# Patient Record
Sex: Female | Born: 1975 | Race: Black or African American | Hispanic: No | Marital: Single | Smoking: Never smoker
Health system: Southern US, Community
[De-identification: ages and names within clinical notes are randomized; demographics above are authoritative.]

## PROBLEM LIST (undated history)

## (undated) DIAGNOSIS — Z801 Family history of malignant neoplasm of trachea, bronchus and lung: Secondary | ICD-10-CM

## (undated) DIAGNOSIS — I10 Essential (primary) hypertension: Secondary | ICD-10-CM

## (undated) DIAGNOSIS — Z923 Personal history of irradiation: Secondary | ICD-10-CM

## (undated) DIAGNOSIS — Z8049 Family history of malignant neoplasm of other genital organs: Secondary | ICD-10-CM

## (undated) DIAGNOSIS — R519 Headache, unspecified: Secondary | ICD-10-CM

## (undated) DIAGNOSIS — F419 Anxiety disorder, unspecified: Secondary | ICD-10-CM

## (undated) DIAGNOSIS — Z803 Family history of malignant neoplasm of breast: Secondary | ICD-10-CM

## (undated) DIAGNOSIS — Z973 Presence of spectacles and contact lenses: Secondary | ICD-10-CM

## (undated) HISTORY — PX: EYE SURGERY: SHX253

## (undated) HISTORY — DX: Family history of malignant neoplasm of breast: Z80.3

## (undated) HISTORY — PX: WISDOM TOOTH EXTRACTION: SHX21

## (undated) HISTORY — DX: Family history of malignant neoplasm of trachea, bronchus and lung: Z80.1

## (undated) HISTORY — DX: Family history of malignant neoplasm of other genital organs: Z80.49

---

## 2005-12-29 ENCOUNTER — Encounter: Admission: RE | Admit: 2005-12-29 | Discharge: 2005-12-29 | Payer: Self-pay | Admitting: Occupational Medicine

## 2019-09-16 ENCOUNTER — Other Ambulatory Visit: Payer: Self-pay | Admitting: Obstetrics and Gynecology

## 2019-09-16 DIAGNOSIS — R928 Other abnormal and inconclusive findings on diagnostic imaging of breast: Secondary | ICD-10-CM

## 2020-04-24 ENCOUNTER — Other Ambulatory Visit: Payer: Self-pay

## 2020-04-24 ENCOUNTER — Ambulatory Visit
Admission: RE | Admit: 2020-04-24 | Discharge: 2020-04-24 | Disposition: A | Discharge status: Home / Self Care | Payer: Managed Care, Other (non HMO) | Source: Ambulatory Visit | Attending: Obstetrics and Gynecology | Admitting: Obstetrics and Gynecology

## 2020-04-24 ENCOUNTER — Other Ambulatory Visit: Payer: Self-pay | Admitting: Obstetrics and Gynecology

## 2020-04-24 DIAGNOSIS — R928 Other abnormal and inconclusive findings on diagnostic imaging of breast: Secondary | ICD-10-CM

## 2020-04-26 ENCOUNTER — Other Ambulatory Visit: Payer: Self-pay

## 2020-04-26 ENCOUNTER — Ambulatory Visit
Admission: RE | Admit: 2020-04-26 | Discharge: 2020-04-26 | Disposition: A | Discharge status: Home / Self Care | Payer: Managed Care, Other (non HMO) | Source: Ambulatory Visit | Attending: Obstetrics and Gynecology | Admitting: Obstetrics and Gynecology

## 2020-04-26 DIAGNOSIS — C50919 Malignant neoplasm of unspecified site of unspecified female breast: Secondary | ICD-10-CM

## 2020-04-26 DIAGNOSIS — R928 Other abnormal and inconclusive findings on diagnostic imaging of breast: Secondary | ICD-10-CM

## 2020-04-26 HISTORY — DX: Malignant neoplasm of unspecified site of unspecified female breast: C50.919

## 2020-04-30 ENCOUNTER — Other Ambulatory Visit: Payer: Self-pay

## 2020-04-30 ENCOUNTER — Other Ambulatory Visit: Payer: Self-pay | Admitting: Obstetrics and Gynecology

## 2020-04-30 ENCOUNTER — Ambulatory Visit
Admission: RE | Admit: 2020-04-30 | Discharge: 2020-04-30 | Disposition: A | Discharge status: Home / Self Care | Payer: Managed Care, Other (non HMO) | Source: Ambulatory Visit | Attending: Obstetrics and Gynecology | Admitting: Obstetrics and Gynecology

## 2020-04-30 ENCOUNTER — Other Ambulatory Visit: Payer: Self-pay | Admitting: General Surgery

## 2020-04-30 DIAGNOSIS — C50411 Malignant neoplasm of upper-outer quadrant of right female breast: Secondary | ICD-10-CM

## 2020-04-30 DIAGNOSIS — Z170 Estrogen receptor positive status [ER+]: Secondary | ICD-10-CM

## 2020-04-30 DIAGNOSIS — C50911 Malignant neoplasm of unspecified site of right female breast: Secondary | ICD-10-CM

## 2020-05-02 ENCOUNTER — Other Ambulatory Visit: Payer: Self-pay | Admitting: General Surgery

## 2020-05-02 DIAGNOSIS — Z170 Estrogen receptor positive status [ER+]: Secondary | ICD-10-CM

## 2020-05-02 DIAGNOSIS — C50411 Malignant neoplasm of upper-outer quadrant of right female breast: Secondary | ICD-10-CM

## 2020-05-07 ENCOUNTER — Encounter (HOSPITAL_BASED_OUTPATIENT_CLINIC_OR_DEPARTMENT_OTHER): Payer: Self-pay | Admitting: General Surgery

## 2020-05-07 ENCOUNTER — Other Ambulatory Visit: Payer: Self-pay

## 2020-05-07 ENCOUNTER — Telehealth: Payer: Self-pay | Admitting: Hematology and Oncology

## 2020-05-07 NOTE — Telephone Encounter (Signed)
Ms. Kowalchuk has been rescheduled to see Dr. Lindi Adie and Raquel Sarna to 5/10 due to her sx date being on 4/29.

## 2020-05-07 NOTE — Progress Notes (Signed)
New Breast Cancer Diagnosis: Right Breast UOQ  Did patient present with symptoms (if so, please note symptoms) or screening mammography?:Screening Calcifications    Location and Extent of disease : right breast. Located in the upper outer quadrant, measured  5 mm in greatest dimension.   Histology per Pathology Report: grade 1-2, DCIS with calcifications 04/26/2020  Receptor Status: ER(positive), PR (positive), Her2-neu (), Ki-(%)  Surgeon and surgical plan, if any: Dr. Barry Dienes 04/30/2020 -Right Breast lumpectomy with radioactive seed localization 05/11/2020 -I will refer her for genetic testing given her age. -I will also refer for medical and radiation oncology consultations.  Medical oncologist, treatment if any:   Dr. Lindi Adie 05/22/2020   Family History of Breast/Ovarian/Prostate Cancer: Maternal Grandmother had cervical cancer.  Maternal second cousin had breast cancer.  Paternal great Aunt had breast cancer.  Lymphedema issues, if any: No    Pain issues, if any:  No  SAFETY ISSUES: Prior radiation? No Pacemaker/ICD? No Possible current pregnancy? Having cycles Is the patient on methotrexate? no  Current Complaints / other details:

## 2020-05-08 ENCOUNTER — Other Ambulatory Visit (HOSPITAL_COMMUNITY)
Admission: RE | Admit: 2020-05-08 | Discharge: 2020-05-08 | Disposition: A | Discharge status: Home / Self Care | Payer: Managed Care, Other (non HMO) | Source: Ambulatory Visit | Attending: General Surgery | Admitting: General Surgery

## 2020-05-08 DIAGNOSIS — Z01812 Encounter for preprocedural laboratory examination: Secondary | ICD-10-CM | POA: Insufficient documentation

## 2020-05-08 DIAGNOSIS — Z20822 Contact with and (suspected) exposure to covid-19: Secondary | ICD-10-CM | POA: Insufficient documentation

## 2020-05-09 ENCOUNTER — Other Ambulatory Visit: Payer: Self-pay

## 2020-05-09 ENCOUNTER — Encounter: Payer: Self-pay | Admitting: Licensed Clinical Social Worker

## 2020-05-09 ENCOUNTER — Ambulatory Visit
Admission: RE | Admit: 2020-05-09 | Discharge: 2020-05-09 | Disposition: A | Discharge status: Home / Self Care | Payer: Managed Care, Other (non HMO) | Source: Ambulatory Visit | Attending: Radiation Oncology | Admitting: Radiation Oncology

## 2020-05-09 ENCOUNTER — Encounter: Payer: Self-pay | Admitting: Radiation Oncology

## 2020-05-09 VITALS — BP 147/99 | HR 72 | Temp 97.8°F | Resp 18 | Ht 63.0 in | Wt 180.2 lb

## 2020-05-09 DIAGNOSIS — D0511 Intraductal carcinoma in situ of right breast: Secondary | ICD-10-CM | POA: Diagnosis present

## 2020-05-09 DIAGNOSIS — Z170 Estrogen receptor positive status [ER+]: Secondary | ICD-10-CM | POA: Diagnosis not present

## 2020-05-09 DIAGNOSIS — Z79899 Other long term (current) drug therapy: Secondary | ICD-10-CM | POA: Insufficient documentation

## 2020-05-09 LAB — SARS CORONAVIRUS 2 (TAT 6-24 HRS): SARS Coronavirus 2: NEGATIVE

## 2020-05-09 NOTE — Progress Notes (Signed)
Radiation Oncology         [phone] ________________________________  Name: Vanessa Ewing        MRN: [id number]  Date of Service: 05/09/2020 DOB: [birth year]  IR:CVELFYBO, Marshall Cork., MD  Stark Klein, MD     REFERRING PHYSICIAN: Stark Klein, MD   DIAGNOSIS: The encounter diagnosis was Ductal carcinoma in situ (DCIS) of right breast.   HISTORY OF PRESENT ILLNESS: Vanessa Ewing is a 45 y.o. female seen at the request of Dr. Barry Dienes for a new diagnosis of right breast cancer.  Patient was found to have calcifications in the right breast and this was seen on screening mammography.  These were noted in the upper outer quadrant and spanned approximately 5 mm.  No ultrasound of the axilla was required and she underwent a stereotactic biopsy on 04/30/2020 that showed an intermediate grade DCIS that was ER/PR positive.  Her case was discussed in multidisciplinary breast oncology conference and includes lumpectomy and adjuvant radiotherapy and antiestrogen.  She is seen today to discuss treatment of her cancer.  She is scheduled to undergo lumpectomy on 05/11/2020.  She is scheduled to meet with Dr. Lindi Adie on 05/22/2020.    PREVIOUS RADIATION THERAPY: No   PAST MEDICAL HISTORY:  Past Medical History:  Diagnosis Date  . Anxiety   . Breast cancer ([facility]) 04/26/2020       PAST SURGICAL HISTORY: Past Surgical History:  Procedure Laterality Date  . CESAREAN SECTION    . EYE SURGERY    . WISDOM TOOTH EXTRACTION       FAMILY HISTORY: History reviewed. No pertinent family history.   SOCIAL HISTORY:  reports that she has never smoked. She has never used smokeless tobacco. She reports current alcohol use. She reports that she does not use drugs. The patient has a teenage daughter. 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    ALLERGIES: Patient has no known allergies.   MEDICATIONS:  Current Outpatient Medications  Medication Sig Dispense Refill  .  ALPRAZolam (XANAX) 0.25 MG tablet Take 0.25 mg by mouth at bedtime as needed for anxiety.    . Biotin 10 MG TABS Take by mouth.    . Cholecalciferol (VITAMIN D) 50 MCG (2000 UT) CAPS Take by mouth.    . zolpidem (AMBIEN) 10 MG tablet Take 10 mg by mouth at bedtime as needed for sleep.     No current facility-administered medications for this encounter.     REVIEW OF SYSTEMS: On review of systems, the patient reports that she is doing well overall. She is nervous about her diagnosis but has read a lot and is happy to move forward with her visits and get her surgery done soon.     PHYSICAL EXAM:  Wt Readings from Last 3 Encounters:  05/09/20 180 lb 3.2 oz (81.7 kg)   Temp Readings from Last 3 Encounters:  05/09/20 97.8 F (36.6 C)   BP Readings from Last 3 Encounters:  05/09/20 (!) 147/99   Pulse Readings from Last 3 Encounters:  05/09/20 72    In general this is a well appearing African American female in no acute distress. She's alert and oriented x4 and appropriate throughout the examination. Cardiopulmonary assessment is negative for acute distress and she exhibits normal effort. Bilateral breast exam is deferred.    ECOG = 0  0 - Asymptomatic (Fully active, able to carry on all predisease activities without restriction)  1 - Symptomatic but completely ambulatory (Restricted  in physically strenuous activity but ambulatory and able to carry out work of a light or sedentary nature. For example, light housework, office work)  2 - Symptomatic, <50% in bed during the day (Ambulatory and capable of all self care but unable to carry out any work activities. Up and about more than 50% of waking hours)  3 - Symptomatic, >50% in bed, but not bedbound (Capable of only limited self-care, confined to bed or chair 50% or more of waking hours)  4 - Bedbound (Completely disabled. Cannot carry on any self-care. Totally confined to bed or chair)  5 - Death   Eustace Pen MM, Creech RH, Tormey  DC, et al. [phone]). "Toxicity and response criteria of the [facility]". [place] Oncol. 5 (6): 649-55    LABORATORY DATA:  No results found for: WBC, HGB, HCT, MCV, PLT No results found for: NA, K, CL, CO2 No results found for: ALT, AST, GGT, ALKPHOS, BILITOT    RADIOGRAPHY: MM Digital Diagnostic Unilat R  Result Date: 04/24/2020 CLINICAL DATA:  Patient presents after screening study for evaluation of RIGHT breast calcifications. EXAM: DIGITAL DIAGNOSTIC UNILATERAL RIGHT MAMMOGRAM TECHNIQUE: Right digital diagnostic mammography was performed. Mammographic images were processed with CAD. COMPARISON:  Baseline screening exam performed 09/13/2019 ACR Breast Density Category c: The breast tissue is heterogeneously dense, which may obscure small masses. FINDINGS: Magnified views are performed of calcifications in the UPPER OUTER QUADRANT of the RIGHT breast. These views demonstrate a group of fine pleomorphic calcifications not associated with layering on true LATERAL projection. Calcifications span 0.5 x 0.3 x 0.3 centimeters. IMPRESSION: Indeterminate RIGHT breast calcifications. RECOMMENDATION: Recommend stereotactic guided core biopsy of RIGHT breast calcifications. I have discussed the findings and recommendations with the patient. If applicable, a reminder letter will be sent to the patient regarding the next appointment. BI-RADS CATEGORY  4: Suspicious. Electronically Signed   By: Nolon Nations M.D.   On: 04/24/2020 14:09   MM DIAG BREAST TOMO UNI LEFT  Result Date: 04/30/2020 CLINICAL DATA:  45 year old female recently diagnosed right breast DCIS presenting for screening of the left breast prior to surgery. EXAM: DIGITAL DIAGNOSTIC UNILATERAL LEFT MAMMOGRAM WITH TOMOSYNTHESIS AND CAD TECHNIQUE: Left digital diagnostic mammography and breast tomosynthesis was performed. The images were evaluated with computer-aided detection. COMPARISON:  Previous exam(s). ACR Breast  Density Category c: The breast tissue is heterogeneously dense, which may obscure small masses. FINDINGS: No suspicious calcifications, masses or areas of distortion are seen in the left breast. An asymmetry was initially seen in the central posterior left breast, which resolved with spot compression tomosynthesis images. IMPRESSION: No evidence left breast malignancy. RECOMMENDATION: 1.  Continue treatment plan for known right breast DCIS. 2. Annual bilateral diagnostic mammography will be due in August of 2022. I have discussed the findings and recommendations with the patient. If applicable, a reminder letter will be sent to the patient regarding the next appointment. BI-RADS CATEGORY  1: Negative. Electronically Signed   By: Ammie Ferrier M.D.   On: 04/30/2020 14:50   MM CLIP PLACEMENT RIGHT  Result Date: 04/26/2020 CLINICAL DATA:  45 year old female status post stereotactic guided biopsy of the right breast. EXAM: DIAGNOSTIC RIGHT MAMMOGRAM POST STEREOTACTIC BIOPSY COMPARISON:  Previous exam(s). FINDINGS: Mammographic images were obtained following stereotactic guided biopsy of the right breast. The biopsy marking clip is in expected position at the site of biopsy. IMPRESSION: Appropriate positioning of the coil shaped biopsy marking clip at the site of biopsy in the upper  outer right breast. Final Assessment: Post Procedure Mammograms for Marker Placement Electronically Signed   By: Kristopher Oppenheim M.D.   On: 04/26/2020 08:16   MM RT BREAST BX W LOC DEV 1ST LESION IMAGE BX SPEC STEREO GUIDE  Addendum Date: 05/02/2020   ADDENDUM REPORT: 04/30/2020 13:36 ADDENDUM: Pathology revealed LOW to INTERMEDIATE GRADE DUCTAL CARCINOMA IN SITU WITH CALCIFICATIONS of the RIGHT breast, upper outer quadrant, posterior. This was found to be concordant by Dr. Kristopher Oppenheim. Pathology results were discussed with the patient by telephone. The patient reported doing well after the biopsy with tenderness at the site.  Post biopsy instructions and care were reviewed and questions were answered. The patient was encouraged to call [facility] for any additional concerns. My direct phone number was provided. Surgical consultation has been arranged with Dr. Stark Klein at [facility] on April 30, 2020. The patient is scheduled for a LEFT diagnostic mammogram on April 30, 2020. Consideration for a bilateral breast MRI to exclude any additional sites of disease given age and breast density. Pathology results reported by Terie Purser, RN on 04/30/2020. Electronically Signed   By: Kristopher Oppenheim M.D.   On: 04/30/2020 13:36   Result Date: 05/02/2020 CLINICAL DATA:  45 year old female with indeterminate right breast calcifications. EXAM: RIGHT BREAST STEREOTACTIC CORE NEEDLE BIOPSY COMPARISON:  Previous exams. FINDINGS: The patient and I discussed the procedure of stereotactic-guided biopsy including benefits and alternatives. We discussed the high likelihood of a successful procedure. We discussed the risks of the procedure including infection, bleeding, tissue injury, clip migration, and inadequate sampling. Informed written consent was given. The usual time out protocol was performed immediately prior to the procedure. Using sterile technique and 1% Lidocaine as local anesthetic, under stereotactic guidance, a 9 gauge vacuum assisted device was used to perform core needle biopsy of calcifications in the upper-outer quadrant of the right breast using a superior approach. Specimen radiograph was performed showing calcifications in multiple specimens. Specimens with calcifications are identified for pathology. Lesion quadrant: Upper outer quadrant At the conclusion of the procedure, a coil shaped tissue marker clip was deployed into the biopsy cavity. Follow-up 2-view mammogram was performed and dictated separately. IMPRESSION: Stereotactic-guided biopsy of the right breast. No apparent  complications. Electronically Signed: By: Kristopher Oppenheim M.D. On: 04/26/2020 08:17       IMPRESSION/PLAN: 1. ER/PR positive Intermediate Grade DCIS of the right breast. Dr. Lisbeth Renshaw discusses the pathology findings and reviews the nature of early stage breast disease. The consensus from the breast conference includes breast conservation with lumpectomy. Dr. Lisbeth Renshaw reviews the role of external radiotherapy to the breast  to reduce risks of local recurrence followed by antiestrogen therapy. We discussed the risks, benefits, short, and long term effects of radiotherapy, as well as the curative intent, and the patient is interested in proceeding. Dr. Lisbeth Renshaw discusses the delivery and logistics of radiotherapy and anticipates a course of 6 1/2 weeks of radiotherapy. We will see her back a few weeks after surgery to discuss the simulation process and anticipate we starting radiotherapy about 4-6 weeks after surgery.  2. Possible genetic predisposition to malignancy. The patient is a candidate for genetic testing given her personal history. She was offered referral and has already been scheduled for this on 05/22/20. 3. Contraceptive counseling. The patient is not sexually active but is aware if she is within 2 weeks of her next visit with [place], we would recommend pregnancy testing prior to treatment. She's aware  of the need to avoid pregnancy during treatment and if active, should use condoms with a female partner.     In a visit lasting 60 minutes, greater than 50% of the time was spent face to face with Dr. Lisbeth Renshaw and myself (removely via [place]) reviewing her case, as well as in preparation of, discussing, and coordinating the patient's care.  The above documentation reflects my direct findings during this shared patient visit. Please see the separate note by Dr. Lisbeth Renshaw on this date for the remainder of the patient's plan of care.    Carola Rhine, [facility]    **Disclaimer: This note was dictated with voice  recognition software. Similar sounding words can inadvertently be transcribed and this note may contain transcription errors which may not have been corrected upon publication of note.**

## 2020-05-09 NOTE — Progress Notes (Signed)
[place] Psychosocial Distress Screening Clinical Social Work  Clinical Social Work was referred by distress screening protocol.  The patient scored a 5 on the Psychosocial Distress Thermometer which indicates moderate distress. Clinical Social Worker attempted to contact patient by phone to assess for distress and other psychosocial needs.  No answer. Left VM with direct contact information.  ONCBCN DISTRESS SCREENING 05/09/2020  Screening Type Initial Screening  Distress experienced in past week (1-10) 5  Practical problem type Work/school  Emotional problem type Nervousness/Anxiety;Adjusting to illness  Other Contact via cell phone     Jervis Trapani E Aleksa Collinsworth, LCSW

## 2020-05-09 NOTE — Addendum Note (Signed)
Encounter addended by: Cori Razor, RN on: 05/09/2020 8:58 AM  Actions taken: Visit diagnoses modified

## 2020-05-10 ENCOUNTER — Ambulatory Visit
Admission: RE | Admit: 2020-05-10 | Discharge: 2020-05-10 | Disposition: A | Discharge status: Home / Self Care | Payer: Managed Care, Other (non HMO) | Source: Ambulatory Visit | Attending: General Surgery | Admitting: General Surgery

## 2020-05-10 DIAGNOSIS — C50411 Malignant neoplasm of upper-outer quadrant of right female breast: Secondary | ICD-10-CM

## 2020-05-10 DIAGNOSIS — Z170 Estrogen receptor positive status [ER+]: Secondary | ICD-10-CM

## 2020-05-10 NOTE — Anesthesia Preprocedure Evaluation (Addendum)
Anesthesia Evaluation  Patient identified by MRN, date of birth, ID band Patient awake    Reviewed: Allergy & Precautions, NPO status , Patient's Chart, lab work & pertinent test results  Airway Mallampati: II  TM Distance: >3 FB Neck ROM: Full    Dental no notable dental hx.    Pulmonary neg pulmonary ROS,    Pulmonary exam normal breath sounds clear to auscultation       Cardiovascular negative cardio ROS Normal cardiovascular exam Rhythm:Regular Rate:Normal     Neuro/Psych Anxiety negative neurological ROS     GI/Hepatic negative GI ROS, Neg liver ROS,   Endo/Other  negative endocrine ROS  Renal/GU negative Renal ROS     Musculoskeletal negative musculoskeletal ROS (+)   Abdominal (+) + obese,   Peds  Hematology negative hematology ROS (+)   Anesthesia Other Findings RIGHT BREAST CANCER  Reproductive/Obstetrics hcg negative                            Anesthesia Physical Anesthesia Plan  ASA: II  Anesthesia Plan: General   Post-op Pain Management:    Induction: Intravenous  PONV Risk Score and Plan: 3 and Ondansetron, Dexamethasone, Midazolam and Treatment may vary due to age or medical condition  Airway Management Planned: LMA  Additional Equipment:   Intra-op Plan:   Post-operative Plan: Extubation in OR  Informed Consent: I have reviewed the patients History and Physical, chart, labs and discussed the procedure including the risks, benefits and alternatives for the proposed anesthesia with the patient or authorized representative who has indicated his/her understanding and acceptance.     Dental advisory given  Plan Discussed with: CRNA  Anesthesia Plan Comments:        Anesthesia Quick Evaluation

## 2020-05-10 NOTE — H&P (Signed)
Vanessa Ewing Appointment: 04/30/2020 4:30 PM Location: [facility] Patient #: [id number] DOB: December 30, 1975 Single / Language: [name] / Race: Black or African American Female   History of Present Illness Vanessa Klein MD; 04/30/2020 6:29 PM) The patient is a 45 year old female who presents with breast cancer. Pt is a 45 yo F referred for consultation by Dr. Jeanmarie Ewing for a new diagnosis of right breast cancer. She had screening detected right breast calcifications. These were 5 mm in the UOQ. 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 She doesn't have any personal history of cancer before this.  She has some family cancer history, but no strong links. Her maternal grandmother had cervical cancer. A maternal second cousin had breast cancer. Her paternal grandmother had lung cancer and a paternal great aunt had breast cancer. She had menarche at age 11. She is a G2P1 with first child in her late 60s.   dx mammogram 04/24/20 ACR Breast Density Category c: The breast tissue is heterogeneously dense, which may obscure small masses.  FINDINGS: Magnified views are performed of calcifications in the UPPER OUTER QUADRANT of the RIGHT breast. These views demonstrate a group of fine pleomorphic calcifications not associated with layering on true LATERAL projection. Calcifications span 0.5 x 0.3 x 0.3 centimeters.  IMPRESSION: Indeterminate RIGHT breast calcifications.  RECOMMENDATION: Recommend stereotactic guided core biopsy of RIGHT breast calcifications.  I have discussed the findings and recommendations with the patient. If applicable, a reminder letter will be sent to the patient regarding the next appointment.  BI-RADS CATEGORY 4: Suspicious.   pathology 04/26/20 Breast, right, needle core biopsy, UOQ posterior - DUCTAL CARCINOMA IN SITU WITH CALCIFICATIONS. - SEE MICROSCOPIC DESCRIPTION. Microscopic Comment The DCIS has low to intermediate  nuclear grade. Estrogen Receptor: 80%, POSITIVE, MODERATE-STRONG STAINING INTENSITY Progesterone Receptor: 95%, POSITIVE, STRONG STAINING INTENSITY    Past Surgical History Vanessa Ewing, CMA; 04/30/2020 4:37 PM) Breast Biopsy  Right. Cesarean Section - 1   Diagnostic Studies History Vanessa Ewing, CMA; 04/30/2020 4:37 PM) Colonoscopy  never Mammogram  within last year Pap Smear  1-5 years ago  Allergies Vanessa Ewing, CMA; 04/30/2020 4:37 PM) No Known Drug Allergies  [04/30/2020]: Allergies Reconciled   Medication History Vanessa Ewing, CMA; 04/30/2020 4:39 PM) Ambien (10MG  Tablet, Oral) Active. Xanax (2MG  Tablet, Oral) Active. Vitamin D (Cholecalciferol) (10 MCG(400 UNIT) Capsule, Oral) Active. Medications Reconciled  Social History Vanessa Ewing, CMA; 04/30/2020 4:37 PM) Alcohol use  Occasional alcohol use. Caffeine use  Carbonated beverages, Coffee, Tea. No drug use   Family History Vanessa Ewing, CMA; 04/30/2020 4:37 PM) Breast Cancer  Family Members In General. Cancer  Family Members In General. Cervical Cancer  Family Members In General. Diabetes Mellitus  Family Members In General.  Pregnancy / Birth History Vanessa Ewing, [place]; 04/30/2020 4:37 PM) Age at menarche  37 years. Contraceptive History  Oral contraceptives. Gravida  2 Maternal age  42-20 Para  1 Regular periods   Other Problems Vanessa Ewing, CMA; 04/30/2020 4:37 PM) Anxiety Disorder  Breast Cancer  Migraine Headache  Thyroid Disease     Review of Systems Vanessa Ewing CMA; 04/30/2020 4:37 PM) General Not Present- Appetite Loss, Chills, Fatigue, Fever, Night Sweats, Weight Gain and Weight Loss. Breast Not Present- Breast Mass, Breast Pain, Nipple Discharge and Skin Changes. Female Genitourinary Not Present- Frequency, Nocturia, Painful Urination, Pelvic Pain and Urgency. Musculoskeletal Not Present- Back Pain, Joint Pain, Joint Stiffness, Muscle Pain, Muscle Weakness and Swelling of  Extremities. Neurological Present- Headaches.  Not Present- Decreased Memory, Fainting, Numbness, Seizures, Tingling, Tremor, Trouble walking and Weakness. Psychiatric Present- Anxiety. Not Present- Bipolar, Change in Sleep Pattern, Depression, Fearful and Frequent crying. Endocrine Not Present- Cold Intolerance, Excessive Hunger, Hair Changes, Heat Intolerance, Hot flashes and New Diabetes.  Vitals Vanessa Ewing CMA; 04/30/2020 4:39 PM) 04/30/2020 4:39 PM Weight: 180.19 lb Height: 63in Body Surface Area: 1.85 m Body Mass Index: 31.92 kg/m  Temp.: 97.48F  Pulse: 79 (Regular)  P.OX: 99% (Room air) BP: 122/82(Sitting, Left Arm, Standard)       Physical Exam Vanessa Klein MD; 04/30/2020 6:30 PM) General Mental Status-Alert. General Appearance-Consistent with stated age. Hydration-Well hydrated. Voice-Normal.  Head and Neck Head-normocephalic, atraumatic with no lesions or palpable masses. Trachea-midline. Thyroid Gland Characteristics - normal size and consistency.  Eye Eyeball - Bilateral-Extraocular movements intact. Sclera/Conjunctiva - Bilateral-No scleral icterus.  Chest and Lung Exam Chest and lung exam reveals -quiet, even and easy respiratory effort with no use of accessory muscles and on auscultation, normal breath sounds, no adventitious sounds and normal vocal resonance. Inspection Chest Wall - Normal. Back - normal.  Breast Note: minimal ptosis, breasts relatively symmetric bilaterally. no palpable masses. no LAD. no nipple retraction or discharge. no skin dimpling.   Cardiovascular Cardiovascular examination reveals -normal heart sounds, regular rate and rhythm with no murmurs and normal pedal pulses bilaterally.  Abdomen Inspection Inspection of the abdomen reveals - No Hernias. Palpation/Percussion Palpation and Percussion of the abdomen reveal - Soft, Non Tender, No Rebound tenderness, No Rigidity (guarding) and No  hepatosplenomegaly. Auscultation Auscultation of the abdomen reveals - Bowel sounds normal.  Neurologic Neurologic evaluation reveals -alert and oriented x 3 with no impairment of recent or remote memory. Mental Status-Normal.  Musculoskeletal Global Assessment -Note: no gross deformities.  Normal Exam - Left-Upper Extremity Strength Normal and Lower Extremity Strength Normal. Normal Exam - Right-Upper Extremity Strength Normal and Lower Extremity Strength Normal.  Lymphatic Head & Neck  General Head & Neck Lymphatics: Bilateral - Description - Normal. Axillary  General Axillary Region: Bilateral - Description - Normal. Tenderness - Non Tender. Femoral & Inguinal  Generalized Femoral & Inguinal Lymphatics: Bilateral - Description - No Generalized lymphadenopathy.    Assessment & Plan Vanessa Klein MD; 04/30/2020 6:31 PM) MALIGNANT NEOPLASM OF UPPER-OUTER QUADRANT OF RIGHT BREAST IN FEMALE, ESTROGEN RECEPTOR POSITIVE (C50.411) Impression: Pt has a new dx of stage 0 right breast cancer. I discussed options with the patient. She would like to proceed wtih lumpectomy. I will set her up for a seed localized lumpectomy on the right. I discussed pros and cons of MRI. We opted against it.  I will refer her for genetic testing given her age. I will also refer for medical and radiation oncology consultations. These are OK to be post op.  The surgical procedure was described to the patient. I discussed the incision type and location and that we would need radiology involved on with a wire or seed marker and/or sentinel node.  The risks and benefits of the procedure were described to the patient and she wishes to proceed.  We discussed the risks bleeding, infection, damage to other structures, need for further procedures/surgeries. We discussed the risk of seroma. The patient was advised if the area in the breast in cancer, we may need to go back to surgery for additional tissue to  obtain negative margins or for a lymph node biopsy. The patient was advised that these are the most common complications, but that others can occur as well.  They were advised against taking aspirin or other anti-inflammatory agents/blood thinners the week before surgery. Current Plans You are being scheduled for surgery- Our schedulers will call you.  You should hear from our office's scheduling department within 5 working days about the location, date, and time of surgery. We try to make accommodations for patient's preferences in scheduling surgery, but sometimes the OR schedule or the surgeon's schedule prevents [place] from making those accommodations.  If you have not heard from our office [phone]) in 5 working days, call the office and ask for your surgeon's nurse.  If you have other questions about your diagnosis, plan, or surgery, call the office and ask for your surgeon's nurse.  Pt Education - flb breast cancer surgery: discussed with patient and provided information. Referred to Genetic Counseling, for evaluation and follow up [employer]). Routine. Referred to Oncology, for evaluation and follow up (Oncology). Routine. Referred to Radiation Oncology, for evaluation and follow up (Radiation Oncology). Routine.

## 2020-05-10 NOTE — Progress Notes (Signed)

## 2020-05-11 ENCOUNTER — Encounter (HOSPITAL_BASED_OUTPATIENT_CLINIC_OR_DEPARTMENT_OTHER)
Admission: RE | Disposition: A | Discharge status: Home / Self Care | Payer: Self-pay | Source: Home / Self Care | Attending: General Surgery

## 2020-05-11 ENCOUNTER — Encounter: Payer: Self-pay | Admitting: General Surgery

## 2020-05-11 ENCOUNTER — Ambulatory Visit (HOSPITAL_BASED_OUTPATIENT_CLINIC_OR_DEPARTMENT_OTHER)
Admission: RE | Admit: 2020-05-11 | Discharge: 2020-05-11 | Disposition: A | Discharge status: Home / Self Care | Payer: Managed Care, Other (non HMO) | Attending: General Surgery | Admitting: General Surgery

## 2020-05-11 ENCOUNTER — Ambulatory Visit (HOSPITAL_BASED_OUTPATIENT_CLINIC_OR_DEPARTMENT_OTHER): Payer: Managed Care, Other (non HMO) | Admitting: Anesthesiology

## 2020-05-11 ENCOUNTER — Encounter (HOSPITAL_BASED_OUTPATIENT_CLINIC_OR_DEPARTMENT_OTHER): Payer: Self-pay | Admitting: General Surgery

## 2020-05-11 ENCOUNTER — Other Ambulatory Visit: Payer: Self-pay

## 2020-05-11 ENCOUNTER — Ambulatory Visit
Admission: RE | Admit: 2020-05-11 | Discharge: 2020-05-11 | Disposition: A | Discharge status: Home / Self Care | Payer: Managed Care, Other (non HMO) | Source: Ambulatory Visit | Attending: General Surgery | Admitting: General Surgery

## 2020-05-11 DIAGNOSIS — Z801 Family history of malignant neoplasm of trachea, bronchus and lung: Secondary | ICD-10-CM | POA: Insufficient documentation

## 2020-05-11 DIAGNOSIS — Z170 Estrogen receptor positive status [ER+]: Secondary | ICD-10-CM | POA: Diagnosis not present

## 2020-05-11 DIAGNOSIS — C50411 Malignant neoplasm of upper-outer quadrant of right female breast: Secondary | ICD-10-CM | POA: Insufficient documentation

## 2020-05-11 DIAGNOSIS — Z833 Family history of diabetes mellitus: Secondary | ICD-10-CM | POA: Diagnosis not present

## 2020-05-11 DIAGNOSIS — Z79899 Other long term (current) drug therapy: Secondary | ICD-10-CM | POA: Insufficient documentation

## 2020-05-11 DIAGNOSIS — Z8049 Family history of malignant neoplasm of other genital organs: Secondary | ICD-10-CM | POA: Diagnosis not present

## 2020-05-11 DIAGNOSIS — C50911 Malignant neoplasm of unspecified site of right female breast: Secondary | ICD-10-CM | POA: Diagnosis present

## 2020-05-11 DIAGNOSIS — Z803 Family history of malignant neoplasm of breast: Secondary | ICD-10-CM | POA: Diagnosis not present

## 2020-05-11 HISTORY — PX: BREAST LUMPECTOMY WITH RADIOACTIVE SEED LOCALIZATION: SHX6424

## 2020-05-11 HISTORY — DX: Anxiety disorder, unspecified: F41.9

## 2020-05-11 LAB — POCT PREGNANCY, URINE: Preg Test, Ur: NEGATIVE

## 2020-05-11 SURGERY — BREAST LUMPECTOMY WITH RADIOACTIVE SEED LOCALIZATION
Anesthesia: General | Site: Breast | Laterality: Right

## 2020-05-11 MED ORDER — LIDOCAINE HCL (PF) 1 % IJ SOLN
INTRAMUSCULAR | Status: AC
  Filled 2020-05-11: qty 30

## 2020-05-11 MED ORDER — ACETAMINOPHEN 500 MG PO TABS
1000.0000 mg | ORAL_TABLET | ORAL | Status: AC
  Administered 2020-05-11: 1000 mg via ORAL

## 2020-05-11 MED ORDER — FENTANYL CITRATE (PF) 100 MCG/2ML IJ SOLN: 25.0000 ug | INTRAMUSCULAR | Status: DC | PRN

## 2020-05-11 MED ORDER — CEFAZOLIN SODIUM-DEXTROSE 2-4 GM/100ML-% IV SOLN
INTRAVENOUS | Status: AC
  Filled 2020-05-11: qty 100

## 2020-05-11 MED ORDER — ONDANSETRON HCL 4 MG/2ML IJ SOLN
INTRAMUSCULAR | Status: AC
  Filled 2020-05-11: qty 2

## 2020-05-11 MED ORDER — KETOROLAC TROMETHAMINE 30 MG/ML IJ SOLN
INTRAMUSCULAR | Status: AC
  Filled 2020-05-11: qty 1

## 2020-05-11 MED ORDER — ONDANSETRON HCL 4 MG/2ML IJ SOLN
INTRAMUSCULAR | Status: DC | PRN
  Administered 2020-05-11: 4 mg via INTRAVENOUS

## 2020-05-11 MED ORDER — OXYCODONE HCL 5 MG/5ML PO SOLN: 5.0000 mg | Freq: Once | ORAL | Status: AC | PRN

## 2020-05-11 MED ORDER — DEXAMETHASONE SODIUM PHOSPHATE 10 MG/ML IJ SOLN
INTRAMUSCULAR | Status: DC | PRN
  Administered 2020-05-11: 5 mg via INTRAVENOUS

## 2020-05-11 MED ORDER — MIDAZOLAM HCL 2 MG/2ML IJ SOLN
INTRAMUSCULAR | Status: AC
  Filled 2020-05-11: qty 2

## 2020-05-11 MED ORDER — OXYCODONE HCL 5 MG PO TABS
ORAL_TABLET | ORAL | Status: AC
  Filled 2020-05-11: qty 1

## 2020-05-11 MED ORDER — LIDOCAINE HCL (CARDIAC) PF 100 MG/5ML IV SOSY
PREFILLED_SYRINGE | INTRAVENOUS | Status: DC | PRN
  Administered 2020-05-11: 60 mg via INTRAVENOUS

## 2020-05-11 MED ORDER — DEXAMETHASONE SODIUM PHOSPHATE 10 MG/ML IJ SOLN
INTRAMUSCULAR | Status: AC
  Filled 2020-05-11: qty 3

## 2020-05-11 MED ORDER — MIDAZOLAM HCL 5 MG/5ML IJ SOLN
INTRAMUSCULAR | Status: DC | PRN
  Administered 2020-05-11: 2 mg via INTRAVENOUS

## 2020-05-11 MED ORDER — PROMETHAZINE HCL 25 MG/ML IJ SOLN: 6.2500 mg | INTRAMUSCULAR | Status: DC | PRN

## 2020-05-11 MED ORDER — PROPOFOL 10 MG/ML IV BOLUS
INTRAVENOUS | Status: DC | PRN
  Administered 2020-05-11: 200 mg via INTRAVENOUS

## 2020-05-11 MED ORDER — AMISULPRIDE (ANTIEMETIC) 5 MG/2ML IV SOLN: 10.0000 mg | Freq: Once | INTRAVENOUS | Status: DC | PRN

## 2020-05-11 MED ORDER — LIDOCAINE-EPINEPHRINE (PF) 1 %-1:200000 IJ SOLN
INTRAMUSCULAR | Status: DC | PRN
  Administered 2020-05-11: 30 mL via INTRAMUSCULAR

## 2020-05-11 MED ORDER — LIDOCAINE-EPINEPHRINE 1 %-1:100000 IJ SOLN
INTRAMUSCULAR | Status: AC
  Filled 2020-05-11: qty 1

## 2020-05-11 MED ORDER — ACETAMINOPHEN 500 MG PO TABS
ORAL_TABLET | ORAL | Status: AC
  Filled 2020-05-11: qty 2

## 2020-05-11 MED ORDER — OXYCODONE HCL 5 MG PO TABS: 5.0000 mg | ORAL_TABLET | Freq: Four times a day (QID) | ORAL | 0 refills | Status: DC | PRN

## 2020-05-11 MED ORDER — PROPOFOL 10 MG/ML IV BOLUS
INTRAVENOUS | Status: AC
  Filled 2020-05-11: qty 40

## 2020-05-11 MED ORDER — DEXAMETHASONE SODIUM PHOSPHATE 10 MG/ML IJ SOLN
INTRAMUSCULAR | Status: AC
  Filled 2020-05-11: qty 1

## 2020-05-11 MED ORDER — LACTATED RINGERS IV SOLN: INTRAVENOUS | Status: DC

## 2020-05-11 MED ORDER — CHLORHEXIDINE GLUCONATE CLOTH 2 % EX PADS: 6.0000 | MEDICATED_PAD | Freq: Once | CUTANEOUS | Status: DC

## 2020-05-11 MED ORDER — FENTANYL CITRATE (PF) 100 MCG/2ML IJ SOLN
INTRAMUSCULAR | Status: DC | PRN
  Administered 2020-05-11: 100 ug via INTRAVENOUS
  Administered 2020-05-11: 25 ug via INTRAVENOUS

## 2020-05-11 MED ORDER — OXYCODONE HCL 5 MG PO TABS
5.0000 mg | ORAL_TABLET | Freq: Once | ORAL | Status: AC | PRN
Start: 2020-05-11 — End: 2020-05-11
  Administered 2020-05-11: 5 mg via ORAL

## 2020-05-11 MED ORDER — FENTANYL CITRATE (PF) 100 MCG/2ML IJ SOLN
INTRAMUSCULAR | Status: AC
  Filled 2020-05-11: qty 2

## 2020-05-11 MED ORDER — KETOROLAC TROMETHAMINE 30 MG/ML IJ SOLN
30.0000 mg | Freq: Once | INTRAMUSCULAR | Status: AC | PRN
  Administered 2020-05-11: 30 mg via INTRAVENOUS

## 2020-05-11 MED ORDER — CEFAZOLIN SODIUM-DEXTROSE 2-4 GM/100ML-% IV SOLN
2.0000 g | INTRAVENOUS | Status: AC
  Administered 2020-05-11: 2 g via INTRAVENOUS

## 2020-05-11 MED ORDER — LIDOCAINE 2% (20 MG/ML) 5 ML SYRINGE
INTRAMUSCULAR | Status: AC
  Filled 2020-05-11: qty 5

## 2020-05-11 SURGICAL SUPPLY — 49 items
ADH SKN CLS APL DERMABOND .7 (GAUZE/BANDAGES/DRESSINGS) ×1
APL PRP STRL LF DISP 70% ISPRP (MISCELLANEOUS) ×1
BINDER BREAST LRG (GAUZE/BANDAGES/DRESSINGS) ×2 IMPLANT
BINDER BREAST XLRG (GAUZE/BANDAGES/DRESSINGS) IMPLANT
BLADE SURG 15 STRL LF DISP TIS (BLADE) ×1 IMPLANT
BLADE SURG 15 STRL SS (BLADE) ×2
CANISTER SUC SOCK COL 7IN (MISCELLANEOUS) IMPLANT
CANISTER SUCT 1200ML W/VALVE (MISCELLANEOUS) IMPLANT
CHLORAPREP W/TINT 26 (MISCELLANEOUS) ×2 IMPLANT
CLIP VESOCCLUDE LG 6/CT (CLIP) ×2 IMPLANT
COVER BACK TABLE 60X90IN (DRAPES) ×2 IMPLANT
COVER MAYO STAND STRL (DRAPES) ×2 IMPLANT
COVER PROBE W GEL 5X96 (DRAPES) ×2 IMPLANT
DECANTER SPIKE VIAL GLASS SM (MISCELLANEOUS) IMPLANT
DERMABOND ADVANCED (GAUZE/BANDAGES/DRESSINGS) ×1
DERMABOND ADVANCED .7 DNX12 (GAUZE/BANDAGES/DRESSINGS) ×1 IMPLANT
DRAPE LAPAROSCOPIC ABDOMINAL (DRAPES) ×2 IMPLANT
DRAPE UTILITY XL STRL (DRAPES) ×2 IMPLANT
ELECT COATED BLADE 2.86 ST (ELECTRODE) ×2 IMPLANT
ELECT REM PT RETURN 9FT ADLT (ELECTROSURGICAL) ×2
ELECTRODE REM PT RTRN 9FT ADLT (ELECTROSURGICAL) ×1 IMPLANT
GAUZE SPONGE 4X4 12PLY STRL LF (GAUZE/BANDAGES/DRESSINGS) ×2 IMPLANT
GLOVE SURG ENC MOIS LTX SZ6 (GLOVE) ×2 IMPLANT
GLOVE SURG ENC MOIS LTX SZ6.5 (GLOVE) ×4 IMPLANT
GLOVE SURG UNDER POLY LF SZ6.5 (GLOVE) ×2 IMPLANT
GLOVE SURG UNDER POLY LF SZ7 (GLOVE) ×4 IMPLANT
GOWN STRL REUS W/ TWL LRG LVL3 (GOWN DISPOSABLE) ×1 IMPLANT
GOWN STRL REUS W/TWL 2XL LVL3 (GOWN DISPOSABLE) ×2 IMPLANT
GOWN STRL REUS W/TWL LRG LVL3 (GOWN DISPOSABLE) ×2
KIT MARKER MARGIN INK (KITS) ×2 IMPLANT
LIGHT WAVEGUIDE WIDE FLAT (MISCELLANEOUS) IMPLANT
NEEDLE HYPO 25X1 1.5 SAFETY (NEEDLE) ×2 IMPLANT
NS IRRIG 1000ML POUR BTL (IV SOLUTION) ×2 IMPLANT
PACK BASIN DAY SURGERY FS (CUSTOM PROCEDURE TRAY) ×2 IMPLANT
PENCIL SMOKE EVACUATOR (MISCELLANEOUS) ×2 IMPLANT
SLEEVE SCD COMPRESS KNEE MED (STOCKING) ×2 IMPLANT
SPONGE LAP 18X18 RF (DISPOSABLE) ×2 IMPLANT
STRIP CLOSURE SKIN 1/2X4 (GAUZE/BANDAGES/DRESSINGS) ×2 IMPLANT
SUT MNCRL AB 4-0 PS2 18 (SUTURE) ×2 IMPLANT
SUT SILK 2 0 SH (SUTURE) IMPLANT
SUT VIC AB 2-0 SH 27 (SUTURE) ×2
SUT VIC AB 2-0 SH 27XBRD (SUTURE) ×1 IMPLANT
SUT VIC AB 3-0 SH 27 (SUTURE) ×2
SUT VIC AB 3-0 SH 27X BRD (SUTURE) ×1 IMPLANT
SYR CONTROL 10ML LL (SYRINGE) ×2 IMPLANT
TOWEL GREEN STERILE FF (TOWEL DISPOSABLE) ×2 IMPLANT
TRAY FAXITRON CT DISP (TRAY / TRAY PROCEDURE) ×2 IMPLANT
TUBE CONNECTING 20X1/4 (TUBING) ×2 IMPLANT
YANKAUER SUCT BULB TIP NO VENT (SUCTIONS) ×2 IMPLANT

## 2020-05-11 NOTE — Discharge Instructions (Addendum)
[facility] Phone Number [phone]  BREAST BIOPSY/ PARTIAL MASTECTOMY: POST OP INSTRUCTIONS  Always review your discharge instruction sheet given to you by the facility where your surgery was performed.  IF YOU HAVE DISABILITY OR FAMILY LEAVE FORMS, YOU MUST BRING THEM TO THE OFFICE FOR PROCESSING.  DO NOT GIVE THEM TO YOUR DOCTOR.  1. A prescription for pain medication may be given to you upon discharge.  Take your pain medication as prescribed, if needed.  If narcotic pain medicine is not needed, then you may take acetaminophen (Tylenol) or ibuprofen (Advil) as needed. 2. Take your usually prescribed medications unless otherwise directed 3. If you need a refill on your pain medication, please contact your pharmacy.  They will contact our office to request authorization.  Prescriptions will not be filled after 5pm or on week-ends. 4. You should eat very light the first 24 hours after surgery, such as soup, crackers, pudding, etc.  Resume your normal diet the day after surgery. 5. Most patients will experience some swelling and bruising in the breast.  Ice packs and a good support bra will help.  Swelling and bruising can take several days to resolve.  6. It is common to experience some constipation if taking pain medication after surgery.  Increasing fluid intake and taking a stool softener will usually help or prevent this problem from occurring.  A mild laxative (Milk of Magnesia or Miralax) should be taken according to package directions if there are no bowel movements after 48 hours. 7. Unless discharge instructions indicate otherwise, you may remove your bandages 48 hours after surgery, and you may shower at that time.  You may have steri-strips (small skin tapes) in place directly over the incision.  These strips should be left on the skin for 7-10 days.   Any sutures or staples will be removed at the office during your follow-up visit. 8. ACTIVITIES:  You may resume  regular daily activities (gradually increasing) beginning the next day.  Wearing a good support bra or sports bra (or the breast binder) minimizes pain and swelling.  You may have sexual intercourse when it is comfortable. a. You may drive when you no longer are taking prescription pain medication, you can comfortably wear a seatbelt, and you can safely maneuver your car and apply brakes. b. RETURN TO WORK:  __________1 week_______________ 9. You should see your doctor in the office for a follow-up appointment approximately two weeks after your surgery.  Your doctor's nurse will typically make your follow-up appointment when she calls you with your pathology report.  Expect your pathology report 2-3 business days after your surgery.  You may call to check if you do not hear from [place] after three days.   WHEN TO CALL YOUR DOCTOR: 1. Fever over 101.0 2. Nausea and/or vomiting. 3. Extreme swelling or bruising. 4. Continued bleeding from incision. 5. Increased pain, redness, or drainage from the incision.  The clinic staff is available to answer your questions during regular business hours.  Please don't hesitate to call and ask to speak to one of the nurses for clinical concerns.  If you have a medical emergency, go to the nearest emergency room or call 911.  A surgeon from [facility] is always on call at the hospital.  For further questions, please visit [email or web address]    No Tylenol until after 1pm today if needed  Post Anesthesia Home Care Instructions  Activity: Get plenty of rest for the remainder of the day.  A responsible individual must stay with you for 24 hours following the procedure.  For the next 24 hours, DO NOT: -Drive a car -Paediatric nurse -Drink alcoholic beverages -Take any medication unless instructed by your physician -Make any legal decisions or sign important papers.  Meals: Start with liquid foods such as gelatin or soup. Progress to  regular foods as tolerated. Avoid greasy, spicy, heavy foods. If nausea and/or vomiting occur, drink only clear liquids until the nausea and/or vomiting subsides. Call your physician if vomiting continues.  Special Instructions/Symptoms: Your throat may feel dry or sore from the anesthesia or the breathing tube placed in your throat during surgery. If this causes discomfort, gargle with warm salt water. The discomfort should disappear within 24 hours.  If you had a scopolamine patch placed behind your ear for the management of post- operative nausea and/or vomiting:  1. The medication in the patch is effective for 72 hours, after which it should be removed.  Wrap patch in a tissue and discard in the trash. Wash hands thoroughly with soap and water. 2. You may remove the patch earlier than 72 hours if you experience unpleasant side effects which may include dry mouth, dizziness or visual disturbances. 3. Avoid touching the patch. Wash your hands with soap and water after contact with the patch.

## 2020-05-11 NOTE — Anesthesia Procedure Notes (Signed)
Procedure Name: LMA Insertion Date/Time: 05/11/2020 8:02 AM Performed by: Lavonia Dana, CRNA Pre-anesthesia Checklist: Patient identified, Emergency Drugs available, Suction available and Patient being monitored Patient Re-evaluated:Patient Re-evaluated prior to induction Oxygen Delivery Method: Circle system utilized Preoxygenation: Pre-oxygenation with 100% oxygen Induction Type: IV induction Ventilation: Mask ventilation without difficulty LMA: LMA inserted LMA Size: 4.0 Number of attempts: 1 Airway Equipment and Method: Bite block Placement Confirmation: positive ETCO2 Tube secured with: Tape Dental Injury: Teeth and Oropharynx as per pre-operative assessment

## 2020-05-11 NOTE — Op Note (Signed)
Right Breast Radioactive seed localized lumpectomy  Indications: This patient presents with history of right breast cancer, upper outer quadrant, cTis receptors +/+  Pre-operative Diagnosis: right breast cancer  Post-operative Diagnosis: same  Surgeon: Stark Klein   Anesthesia: General endotracheal anesthesia  ASA Class: 2  Procedure Details  The patient was seen in the Holding Room. The risks, benefits, complications, treatment options, and expected outcomes were discussed with the patient. The possibilities of bleeding, infection, the need for additional procedures, failure to diagnose a condition, and creating a complication requiring other procedures or operations were discussed with the patient. The patient concurred with the proposed plan, giving informed consent.  The site of surgery properly noted/marked. The patient was taken to Operating Room # 1, identified, and the procedure verified as right breast seed localized lumpectomy.  The right breast and chest were prepped and draped in standard fashion. A superolateral circumareolar incision was made near the previously placed radioactive seed.  Dissection was carried down around the point of maximum signal intensity. The cautery was used to perform the dissection.   The specimen was inked with the margin marker paint kit.    Specimen radiography confirmed inclusion of the mammographic lesion, the clip, and the seed.  The background signal in the breast was zero.  Additional inferior and medial margins were taken.  Hemostasis was achieved with cautery.  The cavity was marked with clips on each border other than the anterior border.  The wound was irrigated and closed with 3-0 vicryl interrupted deep dermal sutures and 4-0 monocryl running subcuticular suture.      Sterile dressings were applied. At the end of the operation, all sponge, instrument, and needle counts were correct.   Findings: Seed, clip in specimen.  posterior margin is  pectoralis   Estimated Blood Loss:  min         Specimens: right breast tissue with seed, additional lateral margin, additional inferior margin.           Complications:  None; patient tolerated the procedure well.         Disposition: PACU - hemodynamically stable.         Condition: stable

## 2020-05-11 NOTE — Progress Notes (Signed)
May 11, 2020   Patient: Vanessa Ewing  Date of Birth: [birth year]  Date of Visit: 05/11/2020    To Whom It May Concern:   [name] had surgery 05/11/2020.     She should remain out of work until 05/21/2020.  At that time, she may return to full duty.    If you have any questions or concerns, please don't hesitate to call.   Sincerely,        Stark Klein, MD

## 2020-05-11 NOTE — Anesthesia Postprocedure Evaluation (Signed)
Anesthesia Post Note  Patient: NIEMAH SCHWEBKE  Procedure(s) Performed: RIGHT BREAST LUMPECTOMY WITH RADIOACTIVE SEED LOCALIZATION (Right Breast)     Patient location during evaluation: PACU Anesthesia Type: General Level of consciousness: awake Pain management: pain level controlled Vital Signs Assessment: post-procedure vital signs reviewed and stable Respiratory status: spontaneous breathing, nonlabored ventilation, respiratory function stable and patient connected to nasal cannula oxygen Cardiovascular status: blood pressure returned to baseline and stable Postop Assessment: no apparent nausea or vomiting Anesthetic complications: no   No complications documented.  Last Vitals:  Vitals:   05/11/20 0930 05/11/20 0954  BP: (!) 139/95 (!) 155/93  Pulse: 67 63  Resp: 15 16  Temp:  36.7 C  SpO2: 100% 100%    Last Pain:  Vitals:   05/11/20 0952  TempSrc:   PainSc: 4                  Monifah Freehling P Veleria Barnhardt

## 2020-05-11 NOTE — Transfer of Care (Signed)
Immediate Anesthesia Transfer of Care Note  Patient: Vanessa Ewing  Procedure(s) Performed: RIGHT BREAST LUMPECTOMY WITH RADIOACTIVE SEED LOCALIZATION (Right Breast)  Patient Location: PACU  Anesthesia Type:General  Level of Consciousness: awake, alert  and oriented  Airway & Oxygen Therapy: Patient Spontanous Breathing and Patient connected to face mask oxygen  Post-op Assessment: Report given to RN and Post -op Vital signs reviewed and stable  Post vital signs: Reviewed and stable  Last Vitals:  Vitals Value Taken Time  BP 129/82 05/11/20 0908  Temp    Pulse 91 05/11/20 0909  Resp 11 05/11/20 0909  SpO2 100 % 05/11/20 0909  Vitals shown include unvalidated device data.  Last Pain:  Vitals:   05/11/20 0642  TempSrc: Oral  PainSc: 0-No pain      Patients Stated Pain Goal: 2 (59/93/57 0177)  Complications: No complications documented.

## 2020-05-11 NOTE — Interval H&P Note (Signed)
History and Physical Interval Note:  05/11/2020 7:42 AM  Vanessa Ewing  has presented today for surgery, with the diagnosis of RIGHT BREAST CANCER.  The various methods of treatment have been discussed with the patient and family. After consideration of risks, benefits and other options for treatment, the patient has consented to  Procedure(s): RIGHT BREAST LUMPECTOMY WITH RADIOACTIVE SEED LOCALIZATION (Right) as a surgical intervention.  The patient's history has been reviewed, patient examined, no change in status, stable for surgery.  I have reviewed the patient's chart and labs.  Questions were answered to the patient's satisfaction.     Stark Klein

## 2020-05-14 ENCOUNTER — Encounter (HOSPITAL_BASED_OUTPATIENT_CLINIC_OR_DEPARTMENT_OTHER): Payer: Self-pay | Admitting: General Surgery

## 2020-05-14 LAB — SURGICAL PATHOLOGY

## 2020-05-15 ENCOUNTER — Inpatient Hospital Stay: Payer: Managed Care, Other (non HMO) | Admitting: Hematology and Oncology

## 2020-05-15 ENCOUNTER — Inpatient Hospital Stay: Payer: Managed Care, Other (non HMO)

## 2020-05-15 ENCOUNTER — Inpatient Hospital Stay: Payer: Managed Care, Other (non HMO) | Admitting: Genetic Counselor

## 2020-05-21 NOTE — Progress Notes (Signed)
[facility] CONSULT NOTE  Patient Care Team: Enid Skeens., MD as PCP - General (Family Medicine)  CHIEF COMPLAINTS/PURPOSE OF CONSULTATION:  Newly diagnosed right breast DCIS  HISTORY OF PRESENTING ILLNESS:  Vanessa Ewing 45 y.o. female is here because of recent diagnosis of DCIS of the right breast. Screening mammogram showed right breast calcifications. Diagnostic mammogram of the right breast on 04/24/20 showed calcification spanning 0.5cm in the upper outer right breast. Left breast mammogram on 04/30/20 showed no evidence of malignancy. Biopsy on 04/26/20 showed low to intermediate grade DCIS, ER+ 80%, PR+ 95%. She underwent a right lumpectomy on 05/11/20 with Dr. Barry Dienes for which pathology showed benign breast tissue with calcifications. She presents to the clinic today for initial evaluation and discussion of treatment options.   I reviewed her records extensively and collaborated the history with the patient.  SUMMARY OF ONCOLOGIC HISTORY: Oncology History  Ductal carcinoma in situ (DCIS) of right breast  04/26/2020 Initial Diagnosis   Screening mammogram showed right breast calcifications. Diagnostic mammogram of the right breast showed calcifications spanning 0.5cm. Biopsy showed low to intermediate grade DCIS, ER+ 80%, PR+ 95%.    05/11/2020 Surgery   Right lumpectomy Barry Dienes): benign breast tissue with calcifications.     MEDICAL HISTORY:  Past Medical History:  Diagnosis Date  . Anxiety   . Breast cancer ([facility]) 04/26/2020  . Family history of breast cancer   . Family history of cervical cancer   . Family history of lung cancer     SURGICAL HISTORY: Past Surgical History:  Procedure Laterality Date  . BREAST LUMPECTOMY WITH RADIOACTIVE SEED LOCALIZATION Right 05/11/2020   Procedure: RIGHT BREAST LUMPECTOMY WITH RADIOACTIVE SEED LOCALIZATION;  Surgeon: Stark Klein, MD;  Location: [facility];  Service: General;  Laterality: Right;  .  CESAREAN SECTION    . EYE SURGERY    . WISDOM TOOTH EXTRACTION      SOCIAL HISTORY: Social History   Socioeconomic History  . Marital status: Unknown    Spouse name: Not on file  . Number of children: Not on file  . Years of education: Not on file  . Highest education level: Not on file  Occupational History  . Not on file  Tobacco Use  . Smoking status: Never Smoker  . Smokeless tobacco: Never Used  Substance and Sexual Activity  . Alcohol use: Yes    Comment: occas  . Drug use: Never  . Sexual activity: Not on file  Other Topics Concern  . Not on file  Social History Narrative  . Not on file   Social Determinants of Health   Financial Resource Strain: Not on file  Food Insecurity: Not on file  Transportation Needs: Not on file  Physical Activity: Not on file  Stress: Not on file  Social Connections: Not on file  Intimate Partner Violence: Not At Risk  . Fear of Current or Ex-Partner: No  . Emotionally Abused: No  . Physically Abused: No  . Sexually Abused: No    FAMILY HISTORY: Family History  Problem Relation Age of Onset  . Breast cancer Other        paternal great-aunt (PGM's sister)  . Cervical cancer Maternal Grandmother 60  . Lung cancer Paternal Grandmother        non-smoker  . Breast cancer Cousin        dx 20s/30s, maternal first cousin once removed (mother's cousin)    ALLERGIES:  has No Known Allergies.  MEDICATIONS:  Current Outpatient Medications  Medication Sig Dispense Refill  . ALPRAZolam (XANAX) 0.25 MG tablet Take 0.25 mg by mouth at bedtime as needed for anxiety.    . Biotin 10 MG TABS Take by mouth.    . Cholecalciferol (VITAMIN D) 50 MCG (2000 UT) CAPS Take by mouth.    . zolpidem (AMBIEN) 10 MG tablet Take 10 mg by mouth at bedtime as needed for sleep.     No current facility-administered medications for this visit.    REVIEW OF SYSTEMS:   Constitutional: Denies fevers, chills or abnormal night sweats   All other systems  were reviewed with the patient and are negative.  PHYSICAL EXAMINATION: ECOG PERFORMANCE STATUS: 1 - Symptomatic but completely ambulatory  Vitals:   05/22/20 1258  BP: 135/79  Pulse: 77  Resp: 18  Temp: (!) 97.5 F (36.4 C)  SpO2: 100%   Filed Weights   05/22/20 1258  Weight: 182 lb 11.2 oz (82.9 kg)      ASSESSMENT AND PLAN:  Ductal carcinoma in situ (DCIS) of right breast 04/26/2020:Screening mammogram showed right breast calcifications. Diagnostic mammogram of the right breast showed calcifications spanning 0.5cm. Biopsy showed low to intermediate grade DCIS, ER+ 80%, PR+ 95%.  05/11/2020: Right lumpectomy Dr. Barry Dienes: Benign breast tissue with calcifications  Pathology review: I discussed with the patient the difference between DCIS and invasive breast cancer. It is considered a precancerous lesion. DCIS is classified as a 0. It is generally detected through mammograms as calcifications. We discussed the significance of grades and its impact on prognosis. We also discussed the importance of ER and PR receptors and their implications to adjuvant treatment options. Prognosis of DCIS dependence on grade, comedo necrosis. It is anticipated that if not treated, 20-30% of DCIS can develop into invasive breast cancer.  Recommendation: 1. adjuvant radiation therapy 2. Followed by antiestrogen therapy with tamoxifen 5 years  Tamoxifen counseling: We discussed the risks and benefits of tamoxifen. These include but not limited to insomnia, hot flashes, mood changes, vaginal dryness, and weight gain. Although rare, serious side effects including endometrial cancer, risk of blood clots were also discussed. We strongly believe that the benefits far outweigh the risks. Patient understands these risks and consented to starting treatment. Planned treatment duration is 5 years.  She is working as a Psychologist, sport and exercise at [employer] She has a 51 year old daughter. Return to clinic after radiation  to start antiestrogen therapy   All questions were answered. The patient knows to call the clinic with any problems, questions or concerns.   Rulon Eisenmenger, MD, MPH 05/22/2020    I, Molly Dorshimer, am acting as scribe for Nicholas Lose, MD.  I have reviewed the above documentation for accuracy and completeness, and I agree with the above.

## 2020-05-22 ENCOUNTER — Encounter: Payer: Self-pay | Admitting: *Deleted

## 2020-05-22 ENCOUNTER — Other Ambulatory Visit: Payer: Self-pay

## 2020-05-22 ENCOUNTER — Inpatient Hospital Stay: Payer: Managed Care, Other (non HMO) | Attending: Genetic Counselor

## 2020-05-22 ENCOUNTER — Encounter: Payer: Self-pay | Admitting: Genetic Counselor

## 2020-05-22 ENCOUNTER — Inpatient Hospital Stay (HOSPITAL_BASED_OUTPATIENT_CLINIC_OR_DEPARTMENT_OTHER): Payer: Managed Care, Other (non HMO) | Admitting: Hematology and Oncology

## 2020-05-22 ENCOUNTER — Other Ambulatory Visit: Payer: Self-pay | Admitting: Genetic Counselor

## 2020-05-22 ENCOUNTER — Inpatient Hospital Stay (HOSPITAL_BASED_OUTPATIENT_CLINIC_OR_DEPARTMENT_OTHER): Payer: Managed Care, Other (non HMO) | Admitting: Genetic Counselor

## 2020-05-22 DIAGNOSIS — D0511 Intraductal carcinoma in situ of right breast: Secondary | ICD-10-CM

## 2020-05-22 DIAGNOSIS — Z170 Estrogen receptor positive status [ER+]: Secondary | ICD-10-CM | POA: Insufficient documentation

## 2020-05-22 DIAGNOSIS — Z801 Family history of malignant neoplasm of trachea, bronchus and lung: Secondary | ICD-10-CM

## 2020-05-22 DIAGNOSIS — Z8049 Family history of malignant neoplasm of other genital organs: Secondary | ICD-10-CM | POA: Diagnosis not present

## 2020-05-22 DIAGNOSIS — Z79899 Other long term (current) drug therapy: Secondary | ICD-10-CM | POA: Diagnosis not present

## 2020-05-22 DIAGNOSIS — Z803 Family history of malignant neoplasm of breast: Secondary | ICD-10-CM | POA: Diagnosis not present

## 2020-05-22 LAB — GENETIC SCREENING ORDER

## 2020-05-22 MED ORDER — DIPHENHYDRAMINE HCL 25 MG PO CAPS
ORAL_CAPSULE | ORAL | Status: AC
  Filled 2020-05-22: qty 1

## 2020-05-22 MED ORDER — ACETAMINOPHEN 325 MG PO TABS
ORAL_TABLET | ORAL | Status: AC
  Filled 2020-05-22: qty 2

## 2020-05-22 NOTE — Assessment & Plan Note (Signed)
04/26/2020:Screening mammogram showed right breast calcifications. Diagnostic mammogram of the right breast showed calcifications spanning 0.5cm. Biopsy showed low to intermediate grade DCIS, ER+ 80%, PR+ 95%.  05/11/2020: Right lumpectomy Dr. Barry Dienes: Benign breast tissue with calcifications  Pathology review: I discussed with the patient the difference between DCIS and invasive breast cancer. It is considered a precancerous lesion. DCIS is classified as a 0. It is generally detected through mammograms as calcifications. We discussed the significance of grades and its impact on prognosis. We also discussed the importance of ER and PR receptors and their implications to adjuvant treatment options. Prognosis of DCIS dependence on grade, comedo necrosis. It is anticipated that if not treated, 20-30% of DCIS can develop into invasive breast cancer.  Recommendation: 1. adjuvant radiation therapy 2. Followed by antiestrogen therapy with tamoxifen 5 years  Tamoxifen counseling: We discussed the risks and benefits of tamoxifen. These include but not limited to insomnia, hot flashes, mood changes, vaginal dryness, and weight gain. Although rare, serious side effects including endometrial cancer, risk of blood clots were also discussed. We strongly believe that the benefits far outweigh the risks. Patient understands these risks and consented to starting treatment. Planned treatment duration is 5 years.  Return to clinic after radiation to start antiestrogen therapy

## 2020-05-22 NOTE — Progress Notes (Signed)
REFERRING PROVIDER: Stark Klein, MD [address] [place],  [place] [address]  PRIMARY PROVIDER:  Enid Skeens., MD  PRIMARY REASON FOR VISIT:  1. Ductal carcinoma in situ (DCIS) of right breast   2. Family history of breast cancer   3. Family history of cervical cancer   4. Family history of lung cancer      HISTORY OF PRESENT ILLNESS:   Vanessa Ewing, a 45 y.o. female, was seen for a Vanessa Ewing cancer genetics consultation at the request of Vanessa Ewing due to a personal and family history of cancer.  Vanessa Ewing presents to clinic today to discuss the possibility of a hereditary predisposition to cancer, genetic testing, and to further clarify her future cancer risks, as well as potential cancer risks for family members.   In April of 2022, at the age of 35, Vanessa Ewing was diagnosed with ductal carcinoma in situ of the right breast. The treatment plan includes surgery (lumpectomy completed 05/11/20).    CANCER HISTORY:  Oncology History  Ductal carcinoma in situ (DCIS) of right breast  04/26/2020 Initial Diagnosis   Screening mammogram showed right breast calcifications. Diagnostic mammogram of the right breast showed calcifications spanning 0.5cm. Biopsy showed low to intermediate grade DCIS, ER+ 80%, PR+ 95%.    05/11/2020 Surgery   Right lumpectomy Vanessa Ewing): benign breast tissue with calcifications.      RISK FACTORS:  Menarche was at age 73.  First live birth at age 38.  OCP use for approximately 5-10 years.  Ovaries intact: yes.  Hysterectomy: no.  Menopausal status: premenopausal.  HRT use: 0 years. Colonoscopy: no; n/a. Mammogram within the last year: yes. Number of breast biopsies: 1. Any excessive radiation exposure in the past: no.   Past Medical History:  Diagnosis Date  . Anxiety   . Breast cancer ([facility]) 04/26/2020  . Family history of breast cancer   . Family history of cervical cancer   . Family history of lung cancer     Past Surgical  History:  Procedure Laterality Date  . BREAST LUMPECTOMY WITH RADIOACTIVE SEED LOCALIZATION Right 05/11/2020   Procedure: RIGHT BREAST LUMPECTOMY WITH RADIOACTIVE SEED LOCALIZATION;  Surgeon: Vanessa Klein, MD;  Location: [facility];  Service: General;  Laterality: Right;  . CESAREAN SECTION    . EYE SURGERY    . WISDOM TOOTH EXTRACTION      Social History   Socioeconomic History  . Marital status: Unknown    Spouse name: Not on file  . Number of children: Not on file  . Years of education: Not on file  . Highest education level: Not on file  Occupational History  . Not on file  Tobacco Use  . Smoking status: Never Smoker  . Smokeless tobacco: Never Used  Substance and Sexual Activity  . Alcohol use: Yes    Comment: occas  . Drug use: Never  . Sexual activity: Not on file  Other Topics Concern  . Not on file  Social History Narrative  . Not on file   Social Determinants of Health   Financial Resource Strain: Not on file  Food Insecurity: Not on file  Transportation Needs: Not on file  Physical Activity: Not on file  Stress: Not on file  Social Connections: Not on file     FAMILY HISTORY:  We obtained a detailed, 4-generation family history.  Significant diagnoses are listed below: Family History  Problem Relation Age of Onset  . Breast cancer Other  paternal great-aunt (PGM's sister)  . Cervical cancer Maternal Grandmother 60  . Lung cancer Paternal Grandmother        non-smoker  . Breast cancer Cousin        dx 20s/30s, maternal first cousin once removed (mother's cousin)   Vanessa Ewing has one daughter (age 55). She does not have any siblings.  Vanessa Ewing mother is alive at age 15 without cancer. There are two maternal aunts (half-sisters to her mother). There is no known cancer among maternal aunts or maternal cousins. Vanessa Ewing maternal grandmother is alive at age 69 with a history of cervical cancer (diagnosed age 54). She does not  have information about her paternal grandfather. There is also a paternal first cousin once removed (mother's first cousin) who was diagnosed with breast cancer in her 33s or 21s.  Vanessa Ewing father is alive at age 86 or 67 without cancer. There are two paternal aunts and two paternal uncles. There is no known cancer among paternal aunts/uncles or paternal cousins. Vanessa Ewing paternal grandmother died in her mid-85s and had lung cancer (diagnosed older than 13, non-smoker). Her paternal grandfather died in his 55s or 77s without cancer. There is a paternal great-aunt (PGM's sister) who had breast cancer (unknown age of diagnosis).  Vanessa Ewing is unaware of previous family history of genetic testing for hereditary cancer risks. Patient's ancestors are of unknown descent. There is no reported Ashkenazi Jewish ancestry. There is no known consanguinity.  GENETIC COUNSELING ASSESSMENT: Vanessa Ewing is a 45 y.o. female with a personal history of breast cancer and a family history of breast cancer, cervical cancer, and lung cancer, which is somewhat suggestive of a hereditary cancer syndrome and predisposition to cancer. We, therefore, discussed and recommended the following at today's visit.   DISCUSSION: We discussed that approximately 5-10% of breast cancer is hereditary, with most cases associated with the BRCA1 and BRCA2 genes. There are other genes that can be associated with hereditary breast cancer syndromes. These include ATM, CHEK2, PALB2, etc. We discussed that testing is beneficial for several reasons, including knowing about other cancer risks, identifying potential screening and risk-reduction options that may be appropriate, and to understand if other family members could be at risk for cancer and allow them to undergo genetic testing.   We reviewed the characteristics, features and inheritance patterns of hereditary cancer syndromes. We also discussed genetic testing, including the appropriate  family members to test, the process of testing, insurance coverage and turn-around-time for results. We discussed the implications of a negative, positive and/or variant of uncertain significant result. We recommended Vanessa Ewing pursue genetic testing for the [facility] CancerNext-Expanded + RNAinsight gene panel.   The CancerNext-Expanded + RNAinsight gene panel offered by [facility] and includes sequencing and rearrangement analysis for the following 77 genes: AIP, ALK, APC, ATM, AXIN2, BAP1, BARD1, BLM, BMPR1A, BRCA1, BRCA2, BRIP1, CDC73, CDH1, CDK4, CDKN1B, CDKN2A, CHEK2, CTNNA1, DICER1, FANCC, FH, FLCN, GALNT12, KIF1B, LZTR1, MAX, MEN1, MET, MLH1, MSH2, MSH3, MSH6, MUTYH, NBN, NF1, NF2, NTHL1, PALB2, PHOX2B, PMS2, POT1, PRKAR1A, PTCH1, PTEN, RAD51C, RAD51D, RB1, RECQL, RET, SDHA, SDHAF2, SDHB, SDHC, SDHD, SMAD4, SMARCA4, SMARCB1, SMARCE1, STK11, SUFU, TMEM127, TP53, TSC1, TSC2, VHL and XRCC2 (sequencing and deletion/duplication); EGFR, EGLN1, HOXB13, KIT, MITF, PDGFRA, POLD1 and POLE (sequencing only); EPCAM and GREM1 (deletion/duplication only). RNA data is routinely analyzed for use in variant interpretation for all genes.  Based on Vanessa Ewing's personal and family history of cancer, she meets medical criteria for genetic testing.  Despite that she meets criteria, there may still be an out of pocket cost. We informed Vanessa Ewing that, per the billing policy of [facility], the out of pocket cost will not be more than $100 since she has breast cancer. If she receives a notification that her OOP will be more than $100, this is a mistake on [facility]'s end and we request that she let us know.  PLAN: After considering the risks, benefits, and limitations, Vanessa Ewing provided informed consent to pursue genetic testing and the blood sample was sent to [facility] for analysis of the CancerNext-Expanded + RNAinsight panel. Results should be available within approximately two-three weeks' time, at which  point they will be disclosed by telephone to Vanessa Ewing, as will any additional recommendations warranted by these results. Vanessa Ewing will receive a summary of her genetic counseling visit and a copy of her results once available. This information will also be available in Epic.   Vanessa Ewing questions were answered to her satisfaction today. Our contact information was provided should additional questions or concerns arise. Thank you for the referral and allowing [place] to share in the care of your patient.   Clint Guy, [place], [facility] Licensed, Certified Dispensing [email or web address] Phone: [phone]  The patient was seen for a total of 30 minutes in face-to-face genetic counseling.  This patient was discussed with Drs. Magrinat, Lindi Adie and/or Burr Medico who agrees with the above.    _______________________________________________________________________ For Office Staff:  Number of people involved in session: 1 Was an Intern/ student involved with case: no

## 2020-05-28 ENCOUNTER — Encounter: Payer: Self-pay | Admitting: *Deleted

## 2020-05-28 ENCOUNTER — Other Ambulatory Visit (HOSPITAL_COMMUNITY): Payer: Managed Care, Other (non HMO)

## 2020-06-04 ENCOUNTER — Telehealth: Payer: Self-pay | Admitting: *Deleted

## 2020-06-04 NOTE — Telephone Encounter (Signed)
"  Vanessa Ewing [phone]) calling to obtain fax number to resend [facility] request for intermittent leave for radiation.  Company gave a paper that was sent a week or two back.  Hopefully was not completed because it is the wrong form." "A form was also sent to surgeon.  Leave needs to start when I started being followed at the [facility] on May 08, 2020.  Radiation has not yet started and I do not know when it will start."  Provided fax no. [phone].  Advised to expect 14-calendar days for completion.  Records request are managed by (SW) [employer] after form completed.  A signed release is required by [employer] if [facility] requests records.  Will call if any questions or needs.

## 2020-06-06 DIAGNOSIS — Z1379 Encounter for other screening for genetic and chromosomal anomalies: Secondary | ICD-10-CM | POA: Insufficient documentation

## 2020-06-07 ENCOUNTER — Encounter: Payer: Self-pay | Admitting: Genetic Counselor

## 2020-06-07 ENCOUNTER — Encounter: Payer: Self-pay | Admitting: Radiation Oncology

## 2020-06-07 ENCOUNTER — Ambulatory Visit: Payer: Self-pay | Admitting: Genetic Counselor

## 2020-06-07 ENCOUNTER — Telehealth: Payer: Self-pay | Admitting: Genetic Counselor

## 2020-06-07 ENCOUNTER — Ambulatory Visit
Admission: RE | Admit: 2020-06-07 | Discharge: 2020-06-07 | Disposition: A | Discharge status: Home / Self Care | Payer: Managed Care, Other (non HMO) | Source: Ambulatory Visit | Attending: Radiation Oncology | Admitting: Radiation Oncology

## 2020-06-07 DIAGNOSIS — D0511 Intraductal carcinoma in situ of right breast: Secondary | ICD-10-CM

## 2020-06-07 DIAGNOSIS — Z1379 Encounter for other screening for genetic and chromosomal anomalies: Secondary | ICD-10-CM

## 2020-06-07 NOTE — Progress Notes (Signed)
Patient denies having a Psychologist, forensic or being pregnant at this time

## 2020-06-07 NOTE — Telephone Encounter (Signed)
LVM that her genetic test results are available and requested that she call back to discuss them.  

## 2020-06-07 NOTE — Progress Notes (Signed)
HPI:  Vanessa Ewing was previously seen in the [place] clinic due to a personal and family history of cancer and concerns regarding a hereditary predisposition to cancer. Please refer to our prior cancer genetics clinic note for more information regarding our discussion, assessment and recommendations, at the time. Vanessa Ewing recent genetic test results were disclosed to her, as were recommendations warranted by these results. These results and recommendations are discussed in more detail below.  CANCER HISTORY:  Oncology History  Ductal carcinoma in situ (DCIS) of right breast  04/26/2020 Initial Diagnosis   Screening mammogram showed right breast calcifications. Diagnostic mammogram of the right breast showed calcifications spanning 0.5cm. Biopsy showed low to intermediate grade DCIS, ER+ 80%, PR+ 95%.    05/11/2020 Surgery   Right lumpectomy Barry Dienes): benign breast tissue with calcifications.   06/06/2020 Genetic Testing   Negative genetic testing:  No pathogenic variants detected on the [facility] CancerNext-Expanded + RNAinsight panel. Two variants of uncertain significance (VUS) were detected - one in the DICER1 gene called p.C1641W (c.4923T>G) and a second in the PALB2 gene called p.R37G (c.109C>G). The report date is 06/06/2020.  The CancerNext-Expanded + RNAinsight gene panel offered by [employer] and includes sequencing and rearrangement analysis for the following 77 genes: AIP, ALK, APC, ATM, AXIN2, BAP1, BARD1, BLM, BMPR1A, BRCA1, BRCA2, BRIP1, CDC73, CDH1, CDK4, CDKN1B, CDKN2A, CHEK2, CTNNA1, DICER1, FANCC, FH, FLCN, GALNT12, KIF1B, LZTR1, MAX, MEN1, MET, MLH1, MSH2, MSH3, MSH6, MUTYH, NBN, NF1, NF2, NTHL1, PALB2, PHOX2B, PMS2, POT1, PRKAR1A, PTCH1, PTEN, RAD51C, RAD51D, RB1, RECQL, RET, SDHA, SDHAF2, SDHB, SDHC, SDHD, SMAD4, SMARCA4, SMARCB1, SMARCE1, STK11, SUFU, TMEM127, TP53, TSC1, TSC2, VHL and XRCC2 (sequencing and deletion/duplication); EGFR, EGLN1, HOXB13, KIT, MITF,  PDGFRA, POLD1 and POLE (sequencing only); EPCAM and GREM1 (deletion/duplication only). RNA data is routinely analyzed for use in variant interpretation for all genes.     FAMILY HISTORY:  We obtained a detailed, 4-generation family history.  Significant diagnoses are listed below: Family History  Problem Relation Age of Onset  . Breast cancer Other        paternal great-aunt (PGM's sister)  . Cervical cancer Maternal Grandmother 60  . Lung cancer Paternal Grandmother        non-smoker  . Breast cancer Cousin        dx 20s/30s, maternal first cousin once removed (mother's cousin)   Vanessa Ewing has one daughter (age 12). She does not have any siblings.  Vanessa Ewing mother is alive at age 61 without cancer. There are two maternal aunts (half-sisters to her mother). There is no known cancer among maternal aunts or maternal cousins. Vanessa Ewing maternal grandmother is alive at age 28 with a history of cervical cancer (diagnosed age 33). She does not have information about her paternal grandfather. There is also a paternal first cousin once removed (mother's first cousin) who was diagnosed with breast cancer in her 24s or 67s.  Vanessa Ewing's father is alive at age 36 or 39 without cancer. There are two paternal aunts and two paternal uncles. There is no known cancer among paternal aunts/uncles or paternal cousins. Vanessa Ewing paternal grandmother died in her mid-87s and had lung cancer (diagnosed older than 10, non-smoker). Her paternal grandfather died in his 45s or 52s without cancer. There is a paternal great-aunt (PGM's sister) who had breast cancer (unknown age of diagnosis).  Vanessa Ewing is unaware of previous family history of genetic testing for hereditary cancer risks. Patient's ancestors are of unknown descent. There  is no reported Ashkenazi Jewish ancestry. There is no known consanguinity.  GENETIC TEST RESULTS: Genetic testing reported out on 06/06/2020 through the [facility] + RNAinsight panel. No pathogenic variants were detected.   The CancerNext-Expanded + RNAinsight gene panel offered by [employer] and includes sequencing and rearrangement analysis for the following 77 genes: AIP, ALK, APC, ATM, AXIN2, BAP1, BARD1, BLM, BMPR1A, BRCA1, BRCA2, BRIP1, CDC73, CDH1, CDK4, CDKN1B, CDKN2A, CHEK2, CTNNA1, DICER1, FANCC, FH, FLCN, GALNT12, KIF1B, LZTR1, MAX, MEN1, MET, MLH1, MSH2, MSH3, MSH6, MUTYH, NBN, NF1, NF2, NTHL1, PALB2, PHOX2B, PMS2, POT1, PRKAR1A, PTCH1, PTEN, RAD51C, RAD51D, RB1, RECQL, RET, SDHA, SDHAF2, SDHB, SDHC, SDHD, SMAD4, SMARCA4, SMARCB1, SMARCE1, STK11, SUFU, TMEM127, TP53, TSC1, TSC2, VHL and XRCC2 (sequencing and deletion/duplication); EGFR, EGLN1, HOXB13, KIT, MITF, PDGFRA, POLD1 and POLE (sequencing only); EPCAM and GREM1 (deletion/duplication only). RNA data is routinely analyzed for use in variant interpretation for all genes. The test report will be scanned into EPIC and located under the Molecular Pathology section of the Results Review tab.  A portion of the result report is included below for reference.     We discussed with Vanessa Ewing that because current genetic testing is not perfect, it is possible there may be a gene mutation in one of these genes that current testing cannot detect, but that chance is small.  We also discussed that there could be another gene that has not yet been discovered, or that we have not yet tested, that is responsible for the cancer diagnoses in the family. It is also possible there is a hereditary cause for the cancer in the family that Vanessa Ewing did not inherit and therefore was not identified in her testing.  Therefore, it is important to remain in touch with cancer genetics in the future so that we can continue to offer Vanessa Ewing the most up to date genetic testing.   Genetic testing did identify two variants of uncertain significance (VUS) - one in the DICER1 gene called p.C1641W (c.4923T>G)  and a second in the PALB2 gene called p.R37G (c.109C>G).  At this time, it is unknown if these variants are associated with increased cancer risk or if they are normal findings, but most variants such as these get reclassified to being inconsequential. They should not be used to make medical management decisions. With time, we suspect the lab will determine the significance of these variants, if any. If we do learn more about them, we will try to contact Vanessa Ewing to discuss it further. However, it is important to stay in touch with [place] periodically and keep the address and phone number up to date.  CANCER SCREENING RECOMMENDATIONS: Vanessa Ewing test result is considered negative (normal).  This means that we have not identified a hereditary cause for her personal and family history of cancer at this time. While reassuring, this does not definitively rule out a hereditary predisposition to cancer. It is still possible that there could be genetic mutations that are undetectable by current technology. There could be genetic mutations in genes that have not been tested or identified to increase cancer risk.  Therefore, it is recommended she continue to follow the cancer management and screening guidelines provided by her oncology and primary healthcare provider.   An individual's cancer risk and medical management are not determined by genetic test results alone. Overall cancer risk assessment incorporates additional factors, including personal medical history, family history, and any available genetic information that may result in a personalized plan for cancer  prevention and surveillance.  RECOMMENDATIONS FOR FAMILY MEMBERS:  Individuals in this family might be at some increased risk of developing cancer, over the general population risk, simply due to the family history of cancer.  We recommended women in this family have a yearly mammogram beginning at age 60, or 68 years younger than the earliest onset of  cancer, an annual clinical breast exam, and perform monthly breast self-exams. Women in this family should also have a gynecological exam as recommended by their primary provider. All family members should be referred for colonoscopy starting at age 46.  It is also possible there is a hereditary cause for the cancer in Vanessa Ewing's family that she did not inherit and therefore was not identified in her.  Based on Vanessa Ewing's family history, we recommended close relatives of her maternal first cousin once removed, who was diagnosed with breast cancer in her 56s or 9s, have genetic counseling and testing. Vanessa Ewing will let us know if we can be of any assistance in coordinating genetic counseling and/or testing for these family members.   FOLLOW-UP: Lastly, we discussed with Vanessa Ewing that cancer genetics is a rapidly advancing field and it is possible that new genetic tests will be appropriate for her and/or her family members in the future. We encouraged her to remain in contact with cancer genetics on an annual basis so we can update her personal and family histories and let her know of advances in cancer genetics that may benefit this family.   Our contact number was provided. Vanessa Ewing questions were answered to her satisfaction, and she knows she is welcome to call us at anytime with additional questions or concerns.   Clint Guy, MS, [facility] Genetic Counselor [email or web address] Phone: [phone]

## 2020-06-07 NOTE — Telephone Encounter (Signed)
Revealed negative genetic testing. Discussed that we do not know why she has breast cancer or why there is cancer in the family. There could be a genetic mutation in the family that Vanessa Ewing did not inherit. There could also be a mutation in a different gene that we are not testing, or our current technology may not be able to detect certain mutations. It will therefore be important for her to stay in contact with genetics to keep up with whether additional testing may be appropriate in the future.   Two variants of uncertain significance were detected - one in the DICER1 gene called p.C1641W (c.4923T>G) and a second in the PALB2 gene called p.R37G (c.109C>G). Her result is still considered normal at this time and should not impact her medical management.

## 2020-06-07 NOTE — Progress Notes (Signed)
Radiation Oncology         [phone] ________________________________  Name: Vanessa Ewing        MRN: [id number]  Date of Service: 06/07/2020 DOB: 1975/10/27  EN:IDPOEUMP, Marshall Cork., MD  Nicholas Lose, MD     REFERRING PHYSICIAN: Nicholas Lose, MD   DIAGNOSIS: The encounter diagnosis was Ductal carcinoma in situ (DCIS) of right breast.   HISTORY OF PRESENT ILLNESS: Vanessa Ewing is a 45 y.o. female with a history of right breast cancer.  The patient was found to have calcifications in the right breast on screening mammography.  These were noted in the upper outer quadrant and spanned approximately 5 mm.  No ultrasound of the axilla was required and she underwent a stereotactic biopsy on 04/30/2020 that showed an intermediate grade DCIS that was ER/PR positive. She underwent lumpectomy on 05/11/2020 and did not reveal any residual disease. She's seen today to discuss adjuvant therapy.  PREVIOUS RADIATION THERAPY: No   PAST MEDICAL HISTORY:  Past Medical History:  Diagnosis Date  . Anxiety   . Breast cancer ([place]) 04/26/2020  . Family history of breast cancer   . Family history of cervical cancer   . Family history of lung cancer        PAST SURGICAL HISTORY: Past Surgical History:  Procedure Laterality Date  . BREAST LUMPECTOMY WITH RADIOACTIVE SEED LOCALIZATION Right 05/11/2020   Procedure: RIGHT BREAST LUMPECTOMY WITH RADIOACTIVE SEED LOCALIZATION;  Surgeon: Stark Klein, MD;  Location: [facility];  Service: General;  Laterality: Right;  . CESAREAN SECTION    . EYE SURGERY    . WISDOM TOOTH EXTRACTION       FAMILY HISTORY:  Family History  Problem Relation Age of Onset  . Breast cancer Other        paternal great-aunt (PGM's sister)  . Cervical cancer Maternal Grandmother 60  . Lung cancer Paternal Grandmother        non-smoker  . Breast cancer Cousin        dx 20s/30s, maternal first cousin once removed (mother's cousin)     SOCIAL HISTORY:   reports that she has never smoked. She has never used smokeless tobacco. She reports current alcohol use. She reports that she does not use drugs. The patient has a teenage daughter. 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    ALLERGIES: Patient has no known allergies.   MEDICATIONS:  Current Outpatient Medications  Medication Sig Dispense Refill  . ALPRAZolam (XANAX) 0.25 MG tablet Take 0.25 mg by mouth at bedtime as needed for anxiety.    . Biotin 10 MG TABS Take by mouth.    . Cholecalciferol (VITAMIN D) 50 MCG (2000 UT) CAPS Take by mouth.    . zolpidem (AMBIEN) 10 MG tablet Take 10 mg by mouth at bedtime as needed for sleep.     No current facility-administered medications for this encounter.     REVIEW OF SYSTEMS: On review of systems, the patient reports that she is doing well since surgery. She has some fullness deep to her incision but denies any feelings of fluid accumulation. No other complaints are verbalized.    PHYSICAL EXAM:  Unable to assess vitals due to encounter type.  In general this is a well appearing African American female in no acute distress. She's alert and oriented x4 and appropriate throughout the examination. Cardiopulmonary assessment is negative for acute distress and she exhibits normal effort. Bilateral breast exam is  deferred.    ECOG = 1  0 - Asymptomatic (Fully active, able to carry on all predisease activities without restriction)  1 - Symptomatic but completely ambulatory (Restricted in physically strenuous activity but ambulatory and able to carry out work of a light or sedentary nature. For example, light housework, office work)  2 - Symptomatic, <50% in bed during the day (Ambulatory and capable of all self care but unable to carry out any work activities. Up and about more than 50% of waking hours)  3 - Symptomatic, >50% in bed, but not bedbound (Capable of only limited self-care, confined to bed or chair  50% or more of waking hours)  4 - Bedbound (Completely disabled. Cannot carry on any self-care. Totally confined to bed or chair)  5 - Death   Eustace Pen MM, Creech RH, Tormey DC, et al. [phone]). "Toxicity and response criteria of the [facility]". [place] Oncol. 5 (6): 649-55    LABORATORY DATA:  No results found for: WBC, HGB, HCT, MCV, PLT No results found for: NA, K, CL, CO2 No results found for: ALT, AST, GGT, ALKPHOS, BILITOT    RADIOGRAPHY: MM Breast Surgical Specimen  Result Date: 05/11/2020 CLINICAL DATA:  Evaluate excision specimen following radioactive seed localization of a right breast lesion. EXAM: SPECIMEN RADIOGRAPH OF THE RIGHT BREAST COMPARISON:  Previous exam(s). FINDINGS: Status post excision of the right breast. The radioactive seed and biopsy marker clip are present, completely intact, and were marked for pathology. IMPRESSION: Specimen radiograph of the right breast. Electronically Signed   By: Lajean Manes M.D.   On: 05/11/2020 08:48   MM RT RADIOACTIVE SEED LOC MAMMO GUIDE  Result Date: 05/10/2020 CLINICAL DATA:  Right breast cancer for seed localization prior to right breast surgery. EXAM: MAMMOGRAPHIC GUIDED RADIOACTIVE SEED LOCALIZATION OF THE RIGHT BREAST COMPARISON:  Previous exam(s). FINDINGS: Patient presents for radioactive seed localization prior to right breast surgery. I met with the patient and we discussed the procedure of seed localization including benefits and alternatives. We discussed the high likelihood of a successful procedure. We discussed the risks of the procedure including infection, bleeding, tissue injury and further surgery. We discussed the low dose of radioactivity involved in the procedure. Informed, written consent was given. The usual time-out protocol was performed immediately prior to the procedure. Using mammographic guidance, sterile technique, 1% lidocaine and an I-125 radioactive seed, coil biopsy clip was  localized using a lateral approach. The follow-up mammogram images confirm the seed in the expected location and were marked for Dr. Barry Dienes. Follow-up survey of the patient confirms presence of the radioactive seed. Order number of I-125 seed:  [id number]. Total activity:  2.202 millicuries reference Date: April 26, 2020 The patient tolerated the procedure well and was released from the [facility]. She was given instructions regarding seed removal. IMPRESSION: Radioactive seed localization right breast. No apparent complications. Electronically Signed   By: Abelardo Diesel M.D.   On: 05/10/2020 15:23       IMPRESSION/PLAN: 1. ER/PR positive Intermediate Grade DCIS of the right breast. Dr. Lisbeth Renshaw discusses the final results of pathology and despite the excellent pathology report, Dr. Lisbeth Renshaw recommends external radiotherapy to the breast  to reduce risks of local recurrence followed by antiestrogen therapy. We discussed the risks, benefits, short, and long term effects of radiotherapy, as well as the curative intent, and the patient is interested in proceeding. Dr. Lisbeth Renshaw discusses the delivery and logistics of radiotherapy and recommends  6 1/2 weeks  of radiotherapy. She will simulate on Tuesday next week at which time she will sign written consent to proceed. 2. Contraceptive counseling. The patient is not sexually active and does not need pregnancy testing prior to proceed with radiotherapy. She's aware of the need to avoid pregnancy during treatment and if active, should use condoms.  This encounter was provided by telemedicine platform MyChart.  The patient has provided two factor identification and has given verbal consent for this type of encounter and has been advised to only accept a meeting of this type in a secure network environment. The time spent during this encounter was 45 minutes including preparation, discussion, and coordination of the patient's care. The attendants for this meeting include  Dr. Lisbeth Renshaw, Hayden Pedro  and [name].  During the encounter, Dr. Lisbeth Renshaw, and Hayden Pedro were located at [facility] Radiation Oncology Department.  Vanessa Ewing was located at work, but in her car on a lunch break.   The above documentation reflects my direct findings during this shared patient visit. Please see the separate note by Dr. Lisbeth Renshaw on this date for the remainder of the patient's plan of care.    Carola Rhine, [facility]    **Disclaimer: This note was dictated with voice recognition software. Similar sounding words can inadvertently be transcribed and this note may contain transcription errors which may not have been corrected upon publication of note.**

## 2020-06-12 ENCOUNTER — Other Ambulatory Visit: Payer: Self-pay

## 2020-06-12 ENCOUNTER — Ambulatory Visit
Admission: RE | Admit: 2020-06-12 | Discharge: 2020-06-12 | Disposition: A | Discharge status: Home / Self Care | Payer: Managed Care, Other (non HMO) | Source: Ambulatory Visit | Attending: Radiation Oncology | Admitting: Radiation Oncology

## 2020-06-12 DIAGNOSIS — Z510 Encounter for antineoplastic radiation therapy: Secondary | ICD-10-CM | POA: Insufficient documentation

## 2020-06-12 DIAGNOSIS — D0512 Intraductal carcinoma in situ of left breast: Secondary | ICD-10-CM | POA: Diagnosis present

## 2020-06-18 ENCOUNTER — Telehealth: Payer: Self-pay | Admitting: Hematology and Oncology

## 2020-06-18 ENCOUNTER — Encounter: Payer: Self-pay | Admitting: *Deleted

## 2020-06-18 NOTE — Telephone Encounter (Signed)
Scheduled appointment per 06/06 sch msg. Patient is aware. 

## 2020-06-19 DIAGNOSIS — D0511 Intraductal carcinoma in situ of right breast: Secondary | ICD-10-CM | POA: Insufficient documentation

## 2020-06-21 ENCOUNTER — Ambulatory Visit: Payer: Managed Care, Other (non HMO) | Admitting: Radiation Oncology

## 2020-06-22 ENCOUNTER — Ambulatory Visit: Payer: Managed Care, Other (non HMO)

## 2020-06-25 ENCOUNTER — Ambulatory Visit: Payer: Managed Care, Other (non HMO)

## 2020-06-26 ENCOUNTER — Ambulatory Visit: Payer: Managed Care, Other (non HMO)

## 2020-06-27 ENCOUNTER — Ambulatory Visit: Payer: Managed Care, Other (non HMO)

## 2020-06-28 ENCOUNTER — Ambulatory Visit: Payer: Managed Care, Other (non HMO)

## 2020-06-29 ENCOUNTER — Ambulatory Visit: Payer: Managed Care, Other (non HMO)

## 2020-07-02 ENCOUNTER — Ambulatory Visit
Admission: RE | Admit: 2020-07-02 | Discharge: 2020-07-02 | Disposition: A | Discharge status: Home / Self Care | Payer: Managed Care, Other (non HMO) | Source: Ambulatory Visit | Attending: Radiation Oncology | Admitting: Radiation Oncology

## 2020-07-02 ENCOUNTER — Encounter: Payer: Self-pay | Admitting: Radiation Oncology

## 2020-07-02 ENCOUNTER — Other Ambulatory Visit: Payer: Self-pay

## 2020-07-02 DIAGNOSIS — D0511 Intraductal carcinoma in situ of right breast: Secondary | ICD-10-CM | POA: Diagnosis not present

## 2020-07-02 MED ORDER — RADIAPLEXRX EX GEL: Freq: Once | CUTANEOUS | Status: AC

## 2020-07-02 MED ORDER — ALRA NON-METALLIC DEODORANT (RAD-ONC)
1.0000 "application " | Freq: Once | TOPICAL | Status: AC
  Administered 2020-07-02: 1 via TOPICAL

## 2020-07-02 NOTE — Progress Notes (Signed)
We had received a message from Dr. Barry Dienes indicating that the patient did have some wound dehiscence, this was treated with silver nitrate about a week ago, the patient was seen today in the treatment area of her breast is examined and her incision is well-healed with evidence of eschar along the lateral margin of her incision.  No open skin is noted, no drainage is appreciated no fluctuance is present.  She will proceed with her first treatment today.

## 2020-07-03 ENCOUNTER — Ambulatory Visit
Admission: RE | Admit: 2020-07-03 | Discharge: 2020-07-03 | Disposition: A | Discharge status: Home / Self Care | Payer: Managed Care, Other (non HMO) | Source: Ambulatory Visit | Attending: Radiation Oncology | Admitting: Radiation Oncology

## 2020-07-03 DIAGNOSIS — D0511 Intraductal carcinoma in situ of right breast: Secondary | ICD-10-CM | POA: Diagnosis not present

## 2020-07-04 ENCOUNTER — Ambulatory Visit
Admission: RE | Admit: 2020-07-04 | Discharge: 2020-07-04 | Disposition: A | Discharge status: Home / Self Care | Payer: Managed Care, Other (non HMO) | Source: Ambulatory Visit | Attending: Radiation Oncology | Admitting: Radiation Oncology

## 2020-07-04 ENCOUNTER — Other Ambulatory Visit: Payer: Self-pay

## 2020-07-04 DIAGNOSIS — D0511 Intraductal carcinoma in situ of right breast: Secondary | ICD-10-CM | POA: Diagnosis not present

## 2020-07-05 ENCOUNTER — Other Ambulatory Visit: Payer: Self-pay

## 2020-07-05 ENCOUNTER — Ambulatory Visit
Admission: RE | Admit: 2020-07-05 | Discharge: 2020-07-05 | Disposition: A | Discharge status: Home / Self Care | Payer: Managed Care, Other (non HMO) | Source: Ambulatory Visit | Attending: Radiation Oncology | Admitting: Radiation Oncology

## 2020-07-05 DIAGNOSIS — D0511 Intraductal carcinoma in situ of right breast: Secondary | ICD-10-CM | POA: Diagnosis not present

## 2020-07-06 ENCOUNTER — Other Ambulatory Visit: Payer: Self-pay

## 2020-07-06 ENCOUNTER — Ambulatory Visit
Admission: RE | Admit: 2020-07-06 | Discharge: 2020-07-06 | Disposition: A | Discharge status: Home / Self Care | Payer: Managed Care, Other (non HMO) | Source: Ambulatory Visit | Attending: Radiation Oncology | Admitting: Radiation Oncology

## 2020-07-06 DIAGNOSIS — D0511 Intraductal carcinoma in situ of right breast: Secondary | ICD-10-CM | POA: Diagnosis not present

## 2020-07-09 ENCOUNTER — Other Ambulatory Visit: Payer: Self-pay

## 2020-07-09 ENCOUNTER — Ambulatory Visit
Admission: RE | Admit: 2020-07-09 | Discharge: 2020-07-09 | Disposition: A | Discharge status: Home / Self Care | Payer: Managed Care, Other (non HMO) | Source: Ambulatory Visit | Attending: Radiation Oncology | Admitting: Radiation Oncology

## 2020-07-09 DIAGNOSIS — D0511 Intraductal carcinoma in situ of right breast: Secondary | ICD-10-CM | POA: Diagnosis not present

## 2020-07-10 ENCOUNTER — Ambulatory Visit
Admission: RE | Admit: 2020-07-10 | Discharge: 2020-07-10 | Disposition: A | Discharge status: Home / Self Care | Payer: Managed Care, Other (non HMO) | Source: Ambulatory Visit | Attending: Radiation Oncology | Admitting: Radiation Oncology

## 2020-07-10 ENCOUNTER — Other Ambulatory Visit: Payer: Self-pay

## 2020-07-10 DIAGNOSIS — D0511 Intraductal carcinoma in situ of right breast: Secondary | ICD-10-CM | POA: Diagnosis not present

## 2020-07-11 ENCOUNTER — Ambulatory Visit
Admission: RE | Admit: 2020-07-11 | Discharge: 2020-07-11 | Disposition: A | Discharge status: Home / Self Care | Payer: Managed Care, Other (non HMO) | Source: Ambulatory Visit | Attending: Radiation Oncology | Admitting: Radiation Oncology

## 2020-07-11 DIAGNOSIS — D0511 Intraductal carcinoma in situ of right breast: Secondary | ICD-10-CM | POA: Diagnosis not present

## 2020-07-12 ENCOUNTER — Other Ambulatory Visit: Payer: Self-pay

## 2020-07-12 ENCOUNTER — Ambulatory Visit
Admission: RE | Admit: 2020-07-12 | Discharge: 2020-07-12 | Disposition: A | Discharge status: Home / Self Care | Payer: Managed Care, Other (non HMO) | Source: Ambulatory Visit | Attending: Radiation Oncology | Admitting: Radiation Oncology

## 2020-07-12 DIAGNOSIS — D0511 Intraductal carcinoma in situ of right breast: Secondary | ICD-10-CM | POA: Diagnosis not present

## 2020-07-13 ENCOUNTER — Other Ambulatory Visit: Payer: Self-pay

## 2020-07-13 ENCOUNTER — Ambulatory Visit
Admission: RE | Admit: 2020-07-13 | Discharge: 2020-07-13 | Disposition: A | Discharge status: Home / Self Care | Payer: Managed Care, Other (non HMO) | Source: Ambulatory Visit | Attending: Radiation Oncology | Admitting: Radiation Oncology

## 2020-07-13 DIAGNOSIS — D0511 Intraductal carcinoma in situ of right breast: Secondary | ICD-10-CM | POA: Diagnosis present

## 2020-07-17 ENCOUNTER — Ambulatory Visit: Payer: Managed Care, Other (non HMO)

## 2020-07-18 ENCOUNTER — Ambulatory Visit
Admission: RE | Admit: 2020-07-18 | Discharge: 2020-07-18 | Disposition: A | Discharge status: Home / Self Care | Payer: Managed Care, Other (non HMO) | Source: Ambulatory Visit | Attending: Radiation Oncology | Admitting: Radiation Oncology

## 2020-07-18 DIAGNOSIS — D0511 Intraductal carcinoma in situ of right breast: Secondary | ICD-10-CM | POA: Diagnosis not present

## 2020-07-19 ENCOUNTER — Ambulatory Visit
Admission: RE | Admit: 2020-07-19 | Discharge: 2020-07-19 | Disposition: A | Discharge status: Home / Self Care | Payer: Managed Care, Other (non HMO) | Source: Ambulatory Visit | Attending: Radiation Oncology | Admitting: Radiation Oncology

## 2020-07-19 DIAGNOSIS — D0511 Intraductal carcinoma in situ of right breast: Secondary | ICD-10-CM | POA: Diagnosis not present

## 2020-07-20 ENCOUNTER — Encounter: Payer: Self-pay | Admitting: Radiation Oncology

## 2020-07-20 ENCOUNTER — Ambulatory Visit
Admission: RE | Admit: 2020-07-20 | Discharge: 2020-07-20 | Disposition: A | Discharge status: Home / Self Care | Payer: Managed Care, Other (non HMO) | Source: Ambulatory Visit | Attending: Radiation Oncology | Admitting: Radiation Oncology

## 2020-07-20 DIAGNOSIS — D0511 Intraductal carcinoma in situ of right breast: Secondary | ICD-10-CM | POA: Diagnosis not present

## 2020-07-23 ENCOUNTER — Ambulatory Visit
Admission: RE | Admit: 2020-07-23 | Discharge: 2020-07-23 | Disposition: A | Discharge status: Home / Self Care | Payer: Managed Care, Other (non HMO) | Source: Ambulatory Visit | Attending: Radiation Oncology | Admitting: Radiation Oncology

## 2020-07-23 ENCOUNTER — Other Ambulatory Visit: Payer: Self-pay

## 2020-07-23 DIAGNOSIS — D0511 Intraductal carcinoma in situ of right breast: Secondary | ICD-10-CM | POA: Diagnosis not present

## 2020-07-24 ENCOUNTER — Ambulatory Visit
Admission: RE | Admit: 2020-07-24 | Discharge: 2020-07-24 | Disposition: A | Discharge status: Home / Self Care | Payer: Managed Care, Other (non HMO) | Source: Ambulatory Visit | Attending: Radiation Oncology | Admitting: Radiation Oncology

## 2020-07-24 DIAGNOSIS — D0511 Intraductal carcinoma in situ of right breast: Secondary | ICD-10-CM | POA: Diagnosis not present

## 2020-07-25 ENCOUNTER — Ambulatory Visit: Payer: Managed Care, Other (non HMO)

## 2020-07-26 ENCOUNTER — Ambulatory Visit
Admission: RE | Admit: 2020-07-26 | Discharge: 2020-07-26 | Disposition: A | Discharge status: Home / Self Care | Payer: Managed Care, Other (non HMO) | Source: Ambulatory Visit | Attending: Radiation Oncology | Admitting: Radiation Oncology

## 2020-07-26 ENCOUNTER — Other Ambulatory Visit: Payer: Self-pay

## 2020-07-26 DIAGNOSIS — D0511 Intraductal carcinoma in situ of right breast: Secondary | ICD-10-CM | POA: Diagnosis not present

## 2020-07-27 ENCOUNTER — Ambulatory Visit
Admission: RE | Admit: 2020-07-27 | Discharge: 2020-07-27 | Disposition: A | Discharge status: Home / Self Care | Payer: Managed Care, Other (non HMO) | Source: Ambulatory Visit | Attending: Radiation Oncology | Admitting: Radiation Oncology

## 2020-07-27 DIAGNOSIS — D0511 Intraductal carcinoma in situ of right breast: Secondary | ICD-10-CM | POA: Diagnosis not present

## 2020-07-27 NOTE — Progress Notes (Signed)
  Radiation Oncology         [phone]) [phone] ________________________________  Name: Vanessa Ewing MRN: [id number]  Date: 07/20/2020  DOB: 12-Jul-1975  SIMULATION NOTE   NARRATIVE:  The patient underwent simulation today for ongoing radiation therapy.  The existing CT study set was employed for the purpose of virtual treatment planning.  The target and avoidance structures were reviewed and modified as necessary.  Treatment planning then occurred.  The radiation boost prescription was entered and confirmed.  A total of 3 complex treatment devices were fabricated in the form of multi-leaf collimators to shape radiation around the targets while maximally excluding nearby normal structures. I have requested : Isodose Plan.    PLAN:  This modified radiation beam arrangement is intended to continue the current radiation dose to an additional 10 Gy in 5 fractions for a total cumulative dose of 60.4 Gy.    ------------------------------------------------  Jodelle Gross, MD, PhD

## 2020-07-30 ENCOUNTER — Other Ambulatory Visit: Payer: Self-pay

## 2020-07-30 ENCOUNTER — Ambulatory Visit
Admission: RE | Admit: 2020-07-30 | Discharge: 2020-07-30 | Disposition: A | Discharge status: Home / Self Care | Payer: Managed Care, Other (non HMO) | Source: Ambulatory Visit | Attending: Radiation Oncology | Admitting: Radiation Oncology

## 2020-07-30 DIAGNOSIS — D0511 Intraductal carcinoma in situ of right breast: Secondary | ICD-10-CM | POA: Diagnosis not present

## 2020-07-31 ENCOUNTER — Ambulatory Visit
Admission: RE | Admit: 2020-07-31 | Discharge: 2020-07-31 | Disposition: A | Discharge status: Home / Self Care | Payer: Managed Care, Other (non HMO) | Source: Ambulatory Visit | Attending: Radiation Oncology | Admitting: Radiation Oncology

## 2020-07-31 DIAGNOSIS — D0511 Intraductal carcinoma in situ of right breast: Secondary | ICD-10-CM | POA: Diagnosis not present

## 2020-08-01 ENCOUNTER — Ambulatory Visit: Payer: Managed Care, Other (non HMO)

## 2020-08-01 ENCOUNTER — Other Ambulatory Visit: Payer: Self-pay

## 2020-08-01 ENCOUNTER — Ambulatory Visit
Admission: RE | Admit: 2020-08-01 | Discharge: 2020-08-01 | Disposition: A | Discharge status: Home / Self Care | Payer: Managed Care, Other (non HMO) | Source: Ambulatory Visit | Attending: Radiation Oncology | Admitting: Radiation Oncology

## 2020-08-01 DIAGNOSIS — D0511 Intraductal carcinoma in situ of right breast: Secondary | ICD-10-CM | POA: Diagnosis not present

## 2020-08-02 ENCOUNTER — Other Ambulatory Visit: Payer: Self-pay

## 2020-08-02 ENCOUNTER — Ambulatory Visit: Payer: Managed Care, Other (non HMO)

## 2020-08-02 ENCOUNTER — Ambulatory Visit
Admission: RE | Admit: 2020-08-02 | Discharge: 2020-08-02 | Disposition: A | Discharge status: Home / Self Care | Payer: Managed Care, Other (non HMO) | Source: Ambulatory Visit | Attending: Radiation Oncology | Admitting: Radiation Oncology

## 2020-08-02 DIAGNOSIS — D0511 Intraductal carcinoma in situ of right breast: Secondary | ICD-10-CM | POA: Diagnosis not present

## 2020-08-03 ENCOUNTER — Ambulatory Visit: Payer: Managed Care, Other (non HMO) | Admitting: Radiation Oncology

## 2020-08-03 ENCOUNTER — Ambulatory Visit: Payer: Managed Care, Other (non HMO)

## 2020-08-03 ENCOUNTER — Ambulatory Visit
Admission: RE | Admit: 2020-08-03 | Discharge: 2020-08-03 | Disposition: A | Discharge status: Home / Self Care | Payer: Managed Care, Other (non HMO) | Source: Ambulatory Visit | Attending: Radiation Oncology | Admitting: Radiation Oncology

## 2020-08-03 DIAGNOSIS — D0511 Intraductal carcinoma in situ of right breast: Secondary | ICD-10-CM | POA: Diagnosis not present

## 2020-08-06 ENCOUNTER — Ambulatory Visit: Payer: Managed Care, Other (non HMO)

## 2020-08-06 ENCOUNTER — Ambulatory Visit
Admission: RE | Admit: 2020-08-06 | Discharge: 2020-08-06 | Disposition: A | Discharge status: Home / Self Care | Payer: Managed Care, Other (non HMO) | Source: Ambulatory Visit | Attending: Radiation Oncology | Admitting: Radiation Oncology

## 2020-08-06 ENCOUNTER — Other Ambulatory Visit: Payer: Self-pay

## 2020-08-06 DIAGNOSIS — D0511 Intraductal carcinoma in situ of right breast: Secondary | ICD-10-CM | POA: Diagnosis not present

## 2020-08-07 ENCOUNTER — Other Ambulatory Visit: Payer: Self-pay

## 2020-08-07 ENCOUNTER — Ambulatory Visit: Payer: Managed Care, Other (non HMO)

## 2020-08-07 ENCOUNTER — Ambulatory Visit
Admission: RE | Admit: 2020-08-07 | Discharge: 2020-08-07 | Disposition: A | Discharge status: Home / Self Care | Payer: Managed Care, Other (non HMO) | Source: Ambulatory Visit | Attending: Radiation Oncology | Admitting: Radiation Oncology

## 2020-08-07 DIAGNOSIS — D0511 Intraductal carcinoma in situ of right breast: Secondary | ICD-10-CM | POA: Diagnosis not present

## 2020-08-08 ENCOUNTER — Ambulatory Visit: Payer: Managed Care, Other (non HMO)

## 2020-08-09 ENCOUNTER — Other Ambulatory Visit: Payer: Self-pay

## 2020-08-09 ENCOUNTER — Ambulatory Visit
Admission: RE | Admit: 2020-08-09 | Discharge: 2020-08-09 | Disposition: A | Discharge status: Home / Self Care | Payer: Managed Care, Other (non HMO) | Source: Ambulatory Visit | Attending: Radiation Oncology | Admitting: Radiation Oncology

## 2020-08-09 DIAGNOSIS — D0511 Intraductal carcinoma in situ of right breast: Secondary | ICD-10-CM | POA: Diagnosis not present

## 2020-08-10 ENCOUNTER — Other Ambulatory Visit: Payer: Self-pay

## 2020-08-10 ENCOUNTER — Ambulatory Visit: Payer: Managed Care, Other (non HMO)

## 2020-08-10 ENCOUNTER — Ambulatory Visit
Admission: RE | Admit: 2020-08-10 | Discharge: 2020-08-10 | Disposition: A | Discharge status: Home / Self Care | Payer: Managed Care, Other (non HMO) | Source: Ambulatory Visit | Attending: Radiation Oncology | Admitting: Radiation Oncology

## 2020-08-10 DIAGNOSIS — D0511 Intraductal carcinoma in situ of right breast: Secondary | ICD-10-CM | POA: Diagnosis not present

## 2020-08-13 ENCOUNTER — Ambulatory Visit
Admission: RE | Admit: 2020-08-13 | Discharge: 2020-08-13 | Disposition: A | Discharge status: Home / Self Care | Payer: Managed Care, Other (non HMO) | Source: Ambulatory Visit | Attending: Radiation Oncology | Admitting: Radiation Oncology

## 2020-08-13 ENCOUNTER — Other Ambulatory Visit: Payer: Self-pay

## 2020-08-13 ENCOUNTER — Ambulatory Visit: Payer: Managed Care, Other (non HMO)

## 2020-08-13 DIAGNOSIS — L598 Other specified disorders of the skin and subcutaneous tissue related to radiation: Secondary | ICD-10-CM | POA: Diagnosis not present

## 2020-08-13 DIAGNOSIS — D0511 Intraductal carcinoma in situ of right breast: Secondary | ICD-10-CM | POA: Insufficient documentation

## 2020-08-13 DIAGNOSIS — Z170 Estrogen receptor positive status [ER+]: Secondary | ICD-10-CM | POA: Diagnosis not present

## 2020-08-13 DIAGNOSIS — R5383 Other fatigue: Secondary | ICD-10-CM | POA: Diagnosis not present

## 2020-08-13 DIAGNOSIS — Z510 Encounter for antineoplastic radiation therapy: Secondary | ICD-10-CM | POA: Insufficient documentation

## 2020-08-13 NOTE — Progress Notes (Signed)
Patient Care Team: Enid Skeens., MD as PCP - General (Family Medicine)  DIAGNOSIS:    ICD-10-CM   1. Ductal carcinoma in situ (DCIS) of right breast  D05.11       SUMMARY OF ONCOLOGIC HISTORY: Oncology History  Ductal carcinoma in situ (DCIS) of right breast  04/26/2020 Initial Diagnosis   Screening mammogram showed right breast calcifications. Diagnostic mammogram of the right breast showed calcifications spanning 0.5cm. Biopsy showed low to intermediate grade DCIS, ER+ 80%, PR+ 95%.    05/11/2020 Surgery   Right lumpectomy Barry Dienes): benign breast tissue with calcifications.   06/06/2020 Genetic Testing   Negative genetic testing:  No pathogenic variants detected on the [employer] CancerNext-Expanded + RNAinsight panel. Two variants of uncertain significance (VUS) were detected - one in the DICER1 gene called p.C1641W (c.4923T>G) and a second in the PALB2 gene called p.R37G (c.109C>G). The report date is 06/06/2020.  The CancerNext-Expanded + RNAinsight gene panel offered by [employer] and includes sequencing and rearrangement analysis for the following 77 genes: AIP, ALK, APC, ATM, AXIN2, BAP1, BARD1, BLM, BMPR1A, BRCA1, BRCA2, BRIP1, CDC73, CDH1, CDK4, CDKN1B, CDKN2A, CHEK2, CTNNA1, DICER1, FANCC, FH, FLCN, GALNT12, KIF1B, LZTR1, MAX, MEN1, MET, MLH1, MSH2, MSH3, MSH6, MUTYH, NBN, NF1, NF2, NTHL1, PALB2, PHOX2B, PMS2, POT1, PRKAR1A, PTCH1, PTEN, RAD51C, RAD51D, RB1, RECQL, RET, SDHA, SDHAF2, SDHB, SDHC, SDHD, SMAD4, SMARCA4, SMARCB1, SMARCE1, STK11, SUFU, TMEM127, TP53, TSC1, TSC2, VHL and XRCC2 (sequencing and deletion/duplication); EGFR, EGLN1, HOXB13, KIT, MITF, PDGFRA, POLD1 and POLE (sequencing only); EPCAM and GREM1 (deletion/duplication only). RNA data is routinely analyzed for use in variant interpretation for all genes.     CHIEF COMPLIANT: Follow-up of right breast cancer  INTERVAL HISTORY: Chrys Landgrebe is a 45 y.o. with above-mentioned history of right breast DCIS  having undergone a lumpectomy, currently on radiation therapy. She reports to the clinic today for follow-up.  Her major complaints are fatigue and radiation-induced dermatitis.  She works full-time throughout the entire radiation plan  ALLERGIES:  has No Known Allergies.  MEDICATIONS:  Current Outpatient Medications  Medication Sig Dispense Refill   ALPRAZolam (XANAX) 0.25 MG tablet Take 0.25 mg by mouth at bedtime as needed for anxiety.     Biotin 10 MG TABS Take by mouth.     Cholecalciferol (VITAMIN D) 50 MCG (2000 UT) CAPS Take by mouth.     zolpidem (AMBIEN) 10 MG tablet Take 10 mg by mouth at bedtime as needed for sleep.     No current facility-administered medications for this visit.    PHYSICAL EXAMINATION: ECOG PERFORMANCE STATUS: 1 - Symptomatic but completely ambulatory  Vitals:   08/14/20 1517  BP: (!) 158/87  Pulse: 82  Resp: 19  Temp: (!) 97.3 F (36.3 C)  SpO2: 99%   Filed Weights   08/14/20 1517  Weight: 187 lb 3.2 oz (84.9 kg)     ASSESSMENT & PLAN:  Ductal carcinoma in situ (DCIS) of right breast 04/26/2020:Screening mammogram showed right breast calcifications. Diagnostic mammogram of the right breast showed calcifications spanning 0.5cm. Biopsy showed low to intermediate grade DCIS, ER+ 80%, PR+ 95%.  05/11/2020: Right lumpectomy Dr. Barry Dienes: Benign breast tissue with calcifications  Recommendation: 1. adjuvant radiation therapy 07/03/20-08/21/20 2. Followed by antiestrogen therapy with tamoxifen 5 years   Tamoxifen counseling: I discussed with her about pros and cons of tamoxifen.  We decided to start her at 5 mg a day and then increase it to 10 mg if she tolerates it well.  She was already  having some hot flashes recently and is very concerned about that.  I discussed with her about Effexor which can improve hot flashes.  She will call us if she wants a prescription.  She is working as a Psychologist, sport and exercise at [employer] Return to clinic in 3 months for  survivorship care plan visit.    No orders of the defined types were placed in this encounter.  The patient has a good understanding of the overall plan. she agrees with it. she will call with any problems that may develop before the next visit here.  Total time spent: 20 mins including face to face time and time spent for planning, charting and coordination of care  Rulon Eisenmenger, MD, MPH 08/14/2020  I, Thana Ates, am acting as scribe for Dr. Nicholas Lose.  I have reviewed the above documentation for accuracy and completeness, and I agree with the above.

## 2020-08-13 NOTE — Assessment & Plan Note (Signed)
04/26/2020:Screening mammogram showed right breast calcifications. Diagnostic mammogram of the right breast showed calcifications spanning 0.5cm. Biopsy showed low to intermediate grade DCIS, ER+ 80%, PR+ 95%.  05/11/2020: Right lumpectomy Dr. Barry Dienes: Benign breast tissue with calcifications  Recommendation: 1. adjuvant radiation therapy 07/03/20-08/21/20 2. 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

## 2020-08-14 ENCOUNTER — Ambulatory Visit
Admission: RE | Admit: 2020-08-14 | Discharge: 2020-08-14 | Disposition: A | Discharge status: Home / Self Care | Payer: Managed Care, Other (non HMO) | Source: Ambulatory Visit | Attending: Radiation Oncology | Admitting: Radiation Oncology

## 2020-08-14 ENCOUNTER — Inpatient Hospital Stay: Payer: Managed Care, Other (non HMO) | Attending: Genetic Counselor | Admitting: Hematology and Oncology

## 2020-08-14 DIAGNOSIS — D0511 Intraductal carcinoma in situ of right breast: Secondary | ICD-10-CM | POA: Diagnosis not present

## 2020-08-14 DIAGNOSIS — L598 Other specified disorders of the skin and subcutaneous tissue related to radiation: Secondary | ICD-10-CM | POA: Insufficient documentation

## 2020-08-14 DIAGNOSIS — Z170 Estrogen receptor positive status [ER+]: Secondary | ICD-10-CM | POA: Insufficient documentation

## 2020-08-14 DIAGNOSIS — R5383 Other fatigue: Secondary | ICD-10-CM | POA: Insufficient documentation

## 2020-08-14 DIAGNOSIS — Z510 Encounter for antineoplastic radiation therapy: Secondary | ICD-10-CM | POA: Insufficient documentation

## 2020-08-14 MED ORDER — TAMOXIFEN CITRATE 10 MG PO TABS: 10.0000 mg | ORAL_TABLET | Freq: Every day | ORAL | 3 refills | Status: DC

## 2020-08-15 ENCOUNTER — Ambulatory Visit
Admission: RE | Admit: 2020-08-15 | Discharge: 2020-08-15 | Disposition: A | Discharge status: Home / Self Care | Payer: Managed Care, Other (non HMO) | Source: Ambulatory Visit | Attending: Radiation Oncology | Admitting: Radiation Oncology

## 2020-08-15 ENCOUNTER — Encounter: Payer: Self-pay | Admitting: *Deleted

## 2020-08-15 ENCOUNTER — Other Ambulatory Visit: Payer: Self-pay

## 2020-08-15 DIAGNOSIS — D0511 Intraductal carcinoma in situ of right breast: Secondary | ICD-10-CM

## 2020-08-16 ENCOUNTER — Ambulatory Visit: Payer: Managed Care, Other (non HMO)

## 2020-08-16 ENCOUNTER — Ambulatory Visit
Admission: RE | Admit: 2020-08-16 | Discharge: 2020-08-16 | Disposition: A | Discharge status: Home / Self Care | Payer: Managed Care, Other (non HMO) | Source: Ambulatory Visit | Attending: Radiation Oncology | Admitting: Radiation Oncology

## 2020-08-16 ENCOUNTER — Other Ambulatory Visit: Payer: Self-pay

## 2020-08-16 DIAGNOSIS — D0511 Intraductal carcinoma in situ of right breast: Secondary | ICD-10-CM | POA: Diagnosis not present

## 2020-08-17 ENCOUNTER — Other Ambulatory Visit: Payer: Self-pay

## 2020-08-17 ENCOUNTER — Ambulatory Visit
Admission: RE | Admit: 2020-08-17 | Discharge: 2020-08-17 | Disposition: A | Discharge status: Home / Self Care | Payer: Managed Care, Other (non HMO) | Source: Ambulatory Visit | Attending: Radiation Oncology | Admitting: Radiation Oncology

## 2020-08-17 DIAGNOSIS — D0511 Intraductal carcinoma in situ of right breast: Secondary | ICD-10-CM | POA: Diagnosis not present

## 2020-08-20 ENCOUNTER — Other Ambulatory Visit: Payer: Self-pay

## 2020-08-20 ENCOUNTER — Ambulatory Visit: Payer: Managed Care, Other (non HMO)

## 2020-08-20 ENCOUNTER — Ambulatory Visit
Admission: RE | Admit: 2020-08-20 | Discharge: 2020-08-20 | Disposition: A | Discharge status: Home / Self Care | Payer: Managed Care, Other (non HMO) | Source: Ambulatory Visit | Attending: Radiation Oncology | Admitting: Radiation Oncology

## 2020-08-20 DIAGNOSIS — D0511 Intraductal carcinoma in situ of right breast: Secondary | ICD-10-CM | POA: Diagnosis not present

## 2020-08-21 ENCOUNTER — Encounter: Payer: Self-pay | Admitting: Radiation Oncology

## 2020-08-21 ENCOUNTER — Ambulatory Visit
Admission: RE | Admit: 2020-08-21 | Discharge: 2020-08-21 | Disposition: A | Discharge status: Home / Self Care | Payer: Managed Care, Other (non HMO) | Source: Ambulatory Visit | Attending: Radiation Oncology | Admitting: Radiation Oncology

## 2020-08-21 DIAGNOSIS — D0511 Intraductal carcinoma in situ of right breast: Secondary | ICD-10-CM | POA: Diagnosis not present

## 2020-08-22 ENCOUNTER — Other Ambulatory Visit: Payer: Self-pay | Admitting: Adult Health

## 2020-08-22 DIAGNOSIS — D0511 Intraductal carcinoma in situ of right breast: Secondary | ICD-10-CM

## 2020-09-03 NOTE — Progress Notes (Signed)
  Radiation Oncology         [phone] ________________________________  Name: Vanessa Ewing MRN: [id number]  Date: 08/21/2020  DOB: 1975-05-06  End of Treatment Note  Diagnosis:      ER/PR positive Intermediate Grade DCIS of the right breast.   Indication for treatment:  Curative       Radiation treatment dates:   07/02/20-08/21/20  Site/dose:   The patient initially received a dose of 50.4 Gy in 28 fractions to the right breast using whole-breast tangent fields. This was delivered using a 3-D conformal technique. The patient then received a boost to the seroma. This delivered an additional 10 Gy in 5 fractions using  Photons. The total dose was 60.4 Gy.  Narrative: The patient tolerated radiation treatment relatively well.   The patient had some expected skin irritation as she progressed during treatment. Moist desquamation was not present at the end of treatment.  Plan: The patient will receive a call in about one month from the radiation oncology department. She will continue follow up with Dr. Lindi Adie as well.      Carola Rhine, PAC

## 2020-10-01 ENCOUNTER — Ambulatory Visit
Admission: RE | Admit: 2020-10-01 | Discharge: 2020-10-01 | Disposition: A | Discharge status: Home / Self Care | Payer: Managed Care, Other (non HMO) | Source: Ambulatory Visit | Attending: Genetic Counselor | Admitting: Genetic Counselor

## 2020-10-01 DIAGNOSIS — D0511 Intraductal carcinoma in situ of right breast: Secondary | ICD-10-CM | POA: Insufficient documentation

## 2020-10-01 NOTE — Progress Notes (Signed)
  Radiation Oncology         [phone]) [phone] ________________________________  Name: Vanessa Ewing MRN: [id number]  Date of Service: 10/01/2020  DOB: March 19, 1975  Post Treatment Telephone Note  Diagnosis:   ER/PR positive Intermediate Grade DCIS of the right breast.   Interval Since Last Radiation:  6 weeks   07/02/20-08/21/20: The patient initially received a dose of 50.4 Gy in 28 fractions to the right breast using whole-breast tangent fields. This was delivered using a 3-D conformal technique. The patient then received a boost to the seroma. This delivered an additional 10 Gy in 5 fractions using  Photons. The total dose was 60.4 Gy.  Narrative:  The patient was contacted today for routine follow-up. During treatment she did very well with radiotherapy and did not have significant desquamation.    Impression/Plan: 1. ER/PR positive Intermediate Grade DCIS of the right breast. I was unable to reach the patient but left a voicemail and on the message, I discussed that we would be happy to continue to follow her as needed, but she will also continue to follow up with Dr. Lindi Adie in medical oncology. She was counseled on skin care as well as measures to avoid sun exposure to this area.  2. Survivorship. We discussed the importance of survivorship evaluation and encouraged her to attend her upcoming visit with that clinic.     Carola Rhine, PAC

## 2020-10-02 ENCOUNTER — Other Ambulatory Visit: Payer: Self-pay | Admitting: Obstetrics and Gynecology

## 2020-10-02 ENCOUNTER — Other Ambulatory Visit: Payer: Self-pay | Admitting: Family Medicine

## 2020-10-02 DIAGNOSIS — Z853 Personal history of malignant neoplasm of breast: Secondary | ICD-10-CM

## 2020-11-02 ENCOUNTER — Other Ambulatory Visit: Payer: Self-pay | Admitting: Adult Health

## 2020-11-02 DIAGNOSIS — Z853 Personal history of malignant neoplasm of breast: Secondary | ICD-10-CM

## 2020-11-06 ENCOUNTER — Other Ambulatory Visit: Payer: Self-pay | Admitting: Adult Health

## 2020-11-06 ENCOUNTER — Ambulatory Visit
Admission: RE | Admit: 2020-11-06 | Discharge: 2020-11-06 | Disposition: A | Discharge status: Home / Self Care | Payer: Managed Care, Other (non HMO) | Source: Ambulatory Visit | Attending: Obstetrics and Gynecology | Admitting: Obstetrics and Gynecology

## 2020-11-06 ENCOUNTER — Ambulatory Visit
Admission: RE | Admit: 2020-11-06 | Discharge: 2020-11-06 | Disposition: A | Discharge status: Home / Self Care | Payer: Managed Care, Other (non HMO) | Source: Ambulatory Visit | Attending: Adult Health | Admitting: Adult Health

## 2020-11-06 ENCOUNTER — Other Ambulatory Visit: Payer: Self-pay

## 2020-11-06 DIAGNOSIS — Z853 Personal history of malignant neoplasm of breast: Secondary | ICD-10-CM

## 2020-11-06 HISTORY — DX: Personal history of irradiation: Z92.3

## 2020-11-12 ENCOUNTER — Telehealth: Payer: Self-pay | Admitting: *Deleted

## 2020-11-14 NOTE — Progress Notes (Signed)
SURVIVORSHIP VISIT:    BRIEF ONCOLOGIC HISTORY:  Oncology History  Ductal carcinoma in situ (DCIS) of right breast  04/26/2020 Initial Diagnosis   Screening mammogram showed right breast calcifications. Diagnostic mammogram of the right breast showed calcifications spanning 0.5cm. Biopsy showed low to intermediate grade DCIS, ER+ 80%, PR+ 95%.    05/11/2020 Surgery   Right lumpectomy Barry Dienes): benign breast tissue with calcifications.   06/06/2020 Genetic Testing   Negative genetic testing:  No pathogenic variants detected on the [employer] CancerNext-Expanded + RNAinsight panel. Two variants of uncertain significance (VUS) were detected - one in the DICER1 gene called p.C1641W (c.4923T>G) and a second in the PALB2 gene called p.R37G (c.109C>G). The report date is 06/06/2020.  The CancerNext-Expanded + RNAinsight gene panel offered by [employer] and includes sequencing and rearrangement analysis for the following 77 genes: AIP, ALK, APC, ATM, AXIN2, BAP1, BARD1, BLM, BMPR1A, BRCA1, BRCA2, BRIP1, CDC73, CDH1, CDK4, CDKN1B, CDKN2A, CHEK2, CTNNA1, DICER1, FANCC, FH, FLCN, GALNT12, KIF1B, LZTR1, MAX, MEN1, MET, MLH1, MSH2, MSH3, MSH6, MUTYH, NBN, NF1, NF2, NTHL1, PALB2, PHOX2B, PMS2, POT1, PRKAR1A, PTCH1, PTEN, RAD51C, RAD51D, RB1, RECQL, RET, SDHA, SDHAF2, SDHB, SDHC, SDHD, SMAD4, SMARCA4, SMARCB1, SMARCE1, STK11, SUFU, TMEM127, TP53, TSC1, TSC2, VHL and XRCC2 (sequencing and deletion/duplication); EGFR, EGLN1, HOXB13, KIT, MITF, PDGFRA, POLD1 and POLE (sequencing only); EPCAM and GREM1 (deletion/duplication only). RNA data is routinely analyzed for use in variant interpretation for all genes.   07/03/2020 - 08/21/2020 Radiation Therapy   Adj XRT   09/2020 -  Anti-estrogen oral therapy   Tamoxifen daily   11/14/2020 Cancer Staging   Staging form: Breast, AJCC 8th Edition - Clinical: Stage 0 (cTis (DCIS), cN0, cM0, ER+, PR+) - Signed by Gardenia Phlegm, NP on 11/14/2020 Stage prefix: Initial  diagnosis      INTERVAL HISTORY:  Vanessa Ewing to review her survivorship care plan detailing her treatment course for breast cancer, as well as monitoring long-term side effects of that treatment, education regarding health maintenance, screening, and overall wellness and health promotion.     Overall, Vanessa Ewing reports feeling quite well.  Vanessa Ewing started tamoxifen last week.  Vanessa Ewing is taking 10 mg a day.  Vanessa Ewing is mildly fatigued.  Vanessa Ewing denies any vaginal discharge, joint aches and pains, hair thinning, swelling, or any other concerns.  Vanessa Ewing does have intermittent pain in her left breast.  Vanessa Ewing says this was worse last week but better this week.  Overall Vanessa Ewing is doing well.   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  Vanessa Ewing enjoys this.  REVIEW OF SYSTEMS:  Review of Systems  Constitutional:  Positive for fatigue. Negative for appetite change, chills, fever and unexpected weight change.  HENT:   Negative for hearing loss, lump/mass and trouble swallowing.   Eyes:  Negative for eye problems and icterus.  Respiratory:  Negative for chest tightness, cough and shortness of breath.   Cardiovascular:  Negative for chest pain, leg swelling and palpitations.  Gastrointestinal:  Negative for abdominal distention, abdominal pain, constipation, diarrhea, nausea and vomiting.  Endocrine: Negative for hot flashes.  Genitourinary:  Negative for difficulty urinating.   Musculoskeletal:  Negative for arthralgias.  Skin:  Negative for itching and rash.  Neurological:  Negative for dizziness, extremity weakness, headaches and numbness.  Hematological:  Negative for adenopathy. Does not bruise/bleed easily.  Psychiatric/Behavioral:  Negative for depression. The patient is not nervous/anxious.   Breast: Denies any new nodularity, masses, tenderness, nipple changes, or nipple discharge.  ONCOLOGY TREATMENT TEAM:  1. Surgeon:  Dr. Barry Dienes at [facility] 2. Medical Oncologist: Dr. Lindi Adie   3. Radiation Oncologist: Dr. Lisbeth Renshaw    PAST MEDICAL/SURGICAL HISTORY:  Past Medical History:  Diagnosis Date   Anxiety    Breast cancer ([place]) 04/26/2020   Family history of breast cancer    Family history of cervical cancer    Family history of lung cancer    Personal history of radiation therapy    Past Surgical History:  Procedure Laterality Date   BREAST LUMPECTOMY WITH RADIOACTIVE SEED LOCALIZATION Right 05/11/2020   Procedure: RIGHT BREAST LUMPECTOMY WITH RADIOACTIVE SEED LOCALIZATION;  Surgeon: Stark Klein, MD;  Location: [place];  Service: General;  Laterality: Right;   CESAREAN SECTION     EYE SURGERY     WISDOM TOOTH EXTRACTION       ALLERGIES:  No Known Allergies   CURRENT MEDICATIONS:  Outpatient Encounter Medications as of 11/15/2020  Medication Sig   ALPRAZolam (XANAX) 0.25 MG tablet Take 0.25 mg by mouth at bedtime as needed for anxiety.   tamoxifen (NOLVADEX) 10 MG tablet Take 1 tablet (10 mg total) by mouth daily.   zolpidem (AMBIEN) 10 MG tablet Take 10 mg by mouth at bedtime as needed for sleep.   Biotin 10 MG TABS Take by mouth. (Patient not taking: Reported on 11/15/2020)   Cholecalciferol (VITAMIN D) 50 MCG (2000 UT) CAPS Take by mouth. (Patient not taking: Reported on 11/15/2020)   No facility-administered encounter medications on file as of 11/15/2020.     ONCOLOGIC FAMILY HISTORY:  Family History  Problem Relation Age of Onset   Breast cancer Other        paternal great-aunt (PGM's sister)   Cervical cancer Maternal Grandmother 91   Lung cancer Paternal Grandmother        non-smoker   Breast cancer Cousin        dx 20s/30s, maternal first cousin once removed (mother's cousin)     GENETIC COUNSELING/TESTING: See above  SOCIAL HISTORY:  Social History   Socioeconomic History   Marital status: Unknown    Spouse name: Not on file   Number of children: Not on file   Years of education: Not on file   Highest education  level: Not on file  Occupational History   Not on file  Tobacco Use   Smoking status: Never   Smokeless tobacco: Never  Substance and Sexual Activity   Alcohol use: Yes    Comment: occas   Drug use: Never   Sexual activity: Not on file  Other Topics Concern   Not on file  Social History Narrative   Not on file   Social Determinants of Health   Financial Resource Strain: Not on file  Food Insecurity: Not on file  Transportation Needs: Not on file  Physical Activity: Not on file  Stress: Not on file  Social Connections: Not on file  Intimate Partner Violence: Not At Risk   Fear of Current or Ex-Partner: No   Emotionally Abused: No   Physically Abused: No   Sexually Abused: No     OBSERVATIONS/OBJECTIVE:  BP 137/75 (BP Location: Left Arm, Patient Position: Sitting)   Pulse 82   Temp 97.9 F (36.6 C) (Temporal)   Resp 18   Ht 5' 3"  (1.6 m)   Wt 181 lb 11.2 oz (82.4 kg)   SpO2 100%   BMI 32.19 kg/m  GENERAL: Patient is a well appearing female in no  acute distress HEENT:  Sclerae anicteric.  Oropharynx clear and moist. No ulcerations or evidence of oropharyngeal candidiasis. Neck is supple.  NODES:  No cervical, supraclavicular, or axillary lymphadenopathy palpated.  BREAST EXAM:  right breast s/p lumpectomy and radiation, left breast benign LUNGS:  Clear to auscultation bilaterally.  No wheezes or rhonchi. HEART:  Regular rate and rhythm. No murmur appreciated. ABDOMEN:  Soft, nontender.  Positive, normoactive bowel sounds. No organomegaly palpated. MSK:  No focal spinal tenderness to palpation. Full range of motion bilaterally in the upper extremities. EXTREMITIES:  No peripheral edema.   SKIN:  Clear with no obvious rashes or skin changes. No nail dyscrasia. NEURO:  Nonfocal. Well oriented.  Appropriate affect.     LABORATORY DATA:  None for this visit.  DIAGNOSTIC IMAGING:  None for this visit.      ASSESSMENT AND PLAN:  Vanessa Ewing is a pleasant 45  y.o. female with Stage 0 right breast ductal carcinoma in situ, ER+/PR+, diagnosed in 04/2020, treated with lumpectomy, adjuvant radiation therapy, and anti-estrogen therapy with Tamoxifen beginning in 09/2020.  Vanessa Ewing presents to the Survivorship Clinic for our initial meeting and routine follow-up post-completion of treatment for breast cancer.    1. Stage 0 right breast cancer:  Vanessa Ewing is continuing to recover from definitive treatment for breast cancer. Vanessa Ewing will follow-up with her medical oncologist, Dr. Lindi Adie in 6 months with history and physical exam per surveillance protocol.  Vanessa Ewing will continue her anti-estrogen therapy with Tamoxifen. Thus far, Vanessa Ewing is tolerating the Tamoxifen well, with minimal side effects. Vanessa Ewing was instructed to make Dr. Lindi Adie or myself aware if Vanessa Ewing begins to experience any worsening side effects of the medication and I could see her back in clinic to help manage those side effects, as needed. Her mammogram is due 10/2021; orders placed today.   Today, a comprehensive survivorship care plan and treatment summary was reviewed with the patient today detailing her breast cancer diagnosis, treatment course, potential late/long-term effects of treatment, appropriate follow-up care with recommendations for the future, and patient education resources.  A copy of this summary, along with a letter will be sent to the patient's primary care provider via mail/fax/In Basket message after today's visit.    2. Fatigue: I reviewed this can happen with her Tamoxifen and will likely improve the longer Vanessa Ewing stays on the medication.    3. Bone health:    Vanessa Ewing was given education on specific activities to promote bone health.  4. Cancer screening:  Due to Vanessa Ewing's history and her age, Vanessa Ewing should receive screening for skin cancers, colon cancer, and gynecologic cancers.  The information and recommendations are listed on the patient's comprehensive care plan/treatment summary and were reviewed in  detail with the patient.    5. Health maintenance and wellness promotion: Vanessa Ewing was encouraged to consume 5-7 servings of fruits and vegetables per day. We reviewed the "Nutrition Rainbow" handout.  Vanessa Ewing was also encouraged to engage in moderate to vigorous exercise for 30 minutes per day most days of the week. We discussed the [employer] fitness program, which is designed for cancer survivors to help them become more physically fit after cancer treatments.  Vanessa Ewing was instructed to limit her alcohol consumption and continue to abstain from tobacco use.     6. Support services/counseling: It is not uncommon for this period of the Vanessa Ewing was given information regarding our available services and encouraged to contact me with any questions or for help enrolling in  any of our support group/programs.    Follow up instructions:    -Return to cancer center in 6 months for f/u with Dr. Lindi Adie  -Mammogram due in 10/2021 -Follow up with surgery 1 year -Vanessa Ewing is welcome to return back to the Survivorship Clinic at any time; no additional follow-up needed at this time.  -Consider referral back to survivorship as a long-term survivor for continued surveillance  The patient was provided an opportunity to ask questions and all were answered. The patient agreed with the plan and demonstrated an understanding of the instructions.    Total encounter time: 50 minutes*in for face-to-face visit time, chart review, lab review, order entry, care coordination, and documentation of the encounter.  Wilber Bihari, NP 11/15/20 3:09 PM Medical Oncology and Hematology [facility] [place], [place] [address] Tel. [phone]    Fax. [phone]   *Total Encounter Time as defined by the Centers for Medicare and Medicaid Services includes, in addition to the face-to-face time of a patient visit (documented in the note above) non-face-to-face time: obtaining and reviewing outside history,  ordering and reviewing medications, tests or procedures, care coordination (communications with other health care professionals or caregivers) and documentation in the medical record.

## 2020-11-15 ENCOUNTER — Encounter: Payer: Self-pay | Admitting: Adult Health

## 2020-11-15 ENCOUNTER — Other Ambulatory Visit: Payer: Self-pay

## 2020-11-15 ENCOUNTER — Inpatient Hospital Stay: Payer: Managed Care, Other (non HMO) | Attending: Adult Health | Admitting: Adult Health

## 2020-11-15 VITALS — BP 137/75 | HR 82 | Temp 97.9°F | Resp 18 | Ht 63.0 in | Wt 181.7 lb

## 2020-11-15 DIAGNOSIS — D0511 Intraductal carcinoma in situ of right breast: Secondary | ICD-10-CM

## 2020-11-15 DIAGNOSIS — Z923 Personal history of irradiation: Secondary | ICD-10-CM | POA: Diagnosis not present

## 2020-11-15 DIAGNOSIS — F419 Anxiety disorder, unspecified: Secondary | ICD-10-CM | POA: Diagnosis not present

## 2020-11-15 DIAGNOSIS — Z79899 Other long term (current) drug therapy: Secondary | ICD-10-CM | POA: Insufficient documentation

## 2020-11-15 DIAGNOSIS — Z170 Estrogen receptor positive status [ER+]: Secondary | ICD-10-CM | POA: Diagnosis not present

## 2020-11-15 DIAGNOSIS — Z79810 Long term (current) use of selective estrogen receptor modulators (SERMs): Secondary | ICD-10-CM | POA: Diagnosis not present

## 2020-11-15 DIAGNOSIS — R5383 Other fatigue: Secondary | ICD-10-CM | POA: Diagnosis not present

## 2020-11-16 ENCOUNTER — Telehealth: Payer: Self-pay | Admitting: Oncology

## 2020-11-16 NOTE — Telephone Encounter (Signed)
Scheduled appointment per 11/03 los. Left message with appointment time and date.

## 2021-04-17 ENCOUNTER — Encounter (HOSPITAL_COMMUNITY): Payer: Self-pay

## 2021-05-01 ENCOUNTER — Telehealth: Payer: Self-pay | Admitting: Hematology and Oncology

## 2021-05-01 NOTE — Telephone Encounter (Signed)
Per 4/19 called and spoke to pt to reschedule appointment.  Pt confirmed appointment  ?

## 2021-05-16 ENCOUNTER — Inpatient Hospital Stay: Payer: Managed Care, Other (non HMO) | Admitting: Hematology and Oncology

## 2021-05-24 NOTE — Progress Notes (Signed)
Patient Care Team: Enid Skeens., MD as PCP - General (Family Medicine) Nicholas Lose, MD as Consulting Physician (Hematology and Oncology) Kyung Rudd, MD as Consulting Physician (Radiation Oncology) Stark Klein, MD as Consulting Physician (General Surgery)  DIAGNOSIS:  Encounter Diagnosis  Name Primary?   Ductal carcinoma in situ (DCIS) of right breast     SUMMARY OF ONCOLOGIC HISTORY: Oncology History  Ductal carcinoma in situ (DCIS) of right breast  04/26/2020 Initial Diagnosis   Screening mammogram showed right breast calcifications. Diagnostic mammogram of the right breast showed calcifications spanning 0.5cm. Biopsy showed low to intermediate grade DCIS, ER+ 80%, PR+ 95%.    05/11/2020 Surgery   Right lumpectomy Barry Dienes): benign breast tissue with calcifications.   06/06/2020 Genetic Testing   Negative genetic testing:  No pathogenic variants detected on the [employer] CancerNext-Expanded + RNAinsight panel. Two variants of uncertain significance (VUS) were detected - one in the DICER1 gene called p.C1641W (c.4923T>G) and a second in the PALB2 gene called p.R37G (c.109C>G). The report date is 06/06/2020.  The CancerNext-Expanded + RNAinsight gene panel offered by [employer] and includes sequencing and rearrangement analysis for the following 77 genes: AIP, ALK, APC, ATM, AXIN2, BAP1, BARD1, BLM, BMPR1A, BRCA1, BRCA2, BRIP1, CDC73, CDH1, CDK4, CDKN1B, CDKN2A, CHEK2, CTNNA1, DICER1, FANCC, FH, FLCN, GALNT12, KIF1B, LZTR1, MAX, MEN1, MET, MLH1, MSH2, MSH3, MSH6, MUTYH, NBN, NF1, NF2, NTHL1, PALB2, PHOX2B, PMS2, POT1, PRKAR1A, PTCH1, PTEN, RAD51C, RAD51D, RB1, RECQL, RET, SDHA, SDHAF2, SDHB, SDHC, SDHD, SMAD4, SMARCA4, SMARCB1, SMARCE1, STK11, SUFU, TMEM127, TP53, TSC1, TSC2, VHL and XRCC2 (sequencing and deletion/duplication); EGFR, EGLN1, HOXB13, KIT, MITF, PDGFRA, POLD1 and POLE (sequencing only); EPCAM and GREM1 (deletion/duplication only). RNA data is routinely analyzed for use  in variant interpretation for all genes.   07/03/2020 - 08/21/2020 Radiation Therapy   Adj XRT   09/2020 -  Anti-estrogen oral therapy   Tamoxifen daily   11/14/2020 Cancer Staging   Staging form: Breast, AJCC 8th Edition - Clinical: Stage 0 (cTis (DCIS), cN0, cM0, ER+, PR+) - Signed by Gardenia Phlegm, NP on 11/14/2020 Stage prefix: Initial diagnosis      CHIEF COMPLIANT: Follow-up of right breast cancer  on Tamoxifen  INTERVAL HISTORY: Vanessa Ewing is a 46 y.o. with above-mentioned history of right breast DCIS having undergone a lumpectomy, currently on radiation therapy. She reports to the clinic today for follow-up. She states that she has been having some hot flashes. She states that she has some stiffness and aching usually after she lays down and gets up. She complains of some fatigue. She denies any pain. Overall she is tolerating Tamoxifen.   ALLERGIES:  has No Known Allergies.  MEDICATIONS:  Current Outpatient Medications  Medication Sig Dispense Refill   atenolol (TENORMIN) 25 MG tablet Take 1 tablet (25 mg total) by mouth daily.     ALPRAZolam (XANAX) 0.25 MG tablet Take 0.25 mg by mouth at bedtime as needed for anxiety.     Cholecalciferol (VITAMIN D) 50 MCG (2000 UT) CAPS Take by mouth. (Patient not taking: Reported on 11/15/2020)     tamoxifen (NOLVADEX) 10 MG tablet Take 1 tablet (10 mg total) by mouth daily. 90 tablet 3   zolpidem (AMBIEN) 10 MG tablet Take 10 mg by mouth at bedtime as needed for sleep.     No current facility-administered medications for this visit.    PHYSICAL EXAMINATION: ECOG PERFORMANCE STATUS: 1 - Symptomatic but completely ambulatory  Vitals:   06/03/21 1136  BP: 136/79  Pulse:  63  Resp: 16  Temp: 97.7 F (36.5 C)  SpO2: 100%   Filed Weights   06/03/21 1136  Weight: 174 lb 8 oz (79.2 kg)    BREAST: No palpable masses or nodules in either right or left breasts. No palpable axillary supraclavicular or infraclavicular  adenopathy no breast tenderness or nipple discharge. (exam performed in the presence of a chaperone)   No results found for: WBC, HGB, HCT, MCV, PLT, NEUTROABS  ASSESSMENT & PLAN:  Ductal carcinoma in situ (DCIS) of right breast 04/26/2020:Screening mammogram showed right breast calcifications. Diagnostic mammogram of the right breast showed calcifications spanning 0.5cm. Biopsy showed low to intermediate grade DCIS, ER+ 80%, PR+ 95%.  05/11/2020: Right lumpectomy Dr. Barry Dienes: Benign breast tissue with calcifications  Recommendation: 1. adjuvant radiation therapy 07/03/20-08/21/20 2. 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  Return to clinic in 1 year for follow-up   No orders of the defined types were placed in this encounter.  The patient has a good understanding of the overall plan. she agrees with it. she will call with any problems that may develop before the next visit here. Total time spent: 30 mins including face to face time and time spent for planning, charting and co-ordination of care   Harriette Ohara, MD 06/03/21    I Gardiner Coins am scribing for Dr. Lindi Adie  I have reviewed the above documentation for accuracy and completeness, and I agree with the above.

## 2021-06-03 ENCOUNTER — Inpatient Hospital Stay: Payer: Managed Care, Other (non HMO) | Attending: Hematology and Oncology | Admitting: Hematology and Oncology

## 2021-06-03 ENCOUNTER — Other Ambulatory Visit: Payer: Self-pay

## 2021-06-03 DIAGNOSIS — R52 Pain, unspecified: Secondary | ICD-10-CM | POA: Diagnosis not present

## 2021-06-03 DIAGNOSIS — Z170 Estrogen receptor positive status [ER+]: Secondary | ICD-10-CM | POA: Insufficient documentation

## 2021-06-03 DIAGNOSIS — Z79810 Long term (current) use of selective estrogen receptor modulators (SERMs): Secondary | ICD-10-CM | POA: Diagnosis not present

## 2021-06-03 DIAGNOSIS — Z923 Personal history of irradiation: Secondary | ICD-10-CM | POA: Diagnosis not present

## 2021-06-03 DIAGNOSIS — M791 Myalgia, unspecified site: Secondary | ICD-10-CM | POA: Insufficient documentation

## 2021-06-03 DIAGNOSIS — R5383 Other fatigue: Secondary | ICD-10-CM | POA: Insufficient documentation

## 2021-06-03 DIAGNOSIS — D0511 Intraductal carcinoma in situ of right breast: Secondary | ICD-10-CM | POA: Insufficient documentation

## 2021-06-03 DIAGNOSIS — R232 Flushing: Secondary | ICD-10-CM | POA: Insufficient documentation

## 2021-06-03 MED ORDER — ATENOLOL 25 MG PO TABS: 25.0000 mg | ORAL_TABLET | Freq: Every day | ORAL | Status: AC

## 2021-06-03 MED ORDER — TAMOXIFEN CITRATE 10 MG PO TABS: 10.0000 mg | ORAL_TABLET | Freq: Every day | ORAL | 3 refills | Status: DC

## 2021-06-03 NOTE — Assessment & Plan Note (Signed)
04/26/2020:Screening mammogram showed right breast calcifications. Diagnostic mammogram of the right breast showed calcifications spanning 0.5cm. Biopsy showed low to intermediate grade DCIS, ER+ 80%, PR+ 95%.  05/11/2020: Right lumpectomy Dr. Barry Dienes: Benign breast tissue with calcifications  Recommendation: 1. adjuvant radiation therapy 07/03/20-08/21/20 2. 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

## 2021-06-04 ENCOUNTER — Telehealth: Payer: Self-pay | Admitting: Hematology and Oncology

## 2021-06-04 NOTE — Telephone Encounter (Signed)
Scheduled appointment per 5/22 los. Left message.

## 2021-10-14 ENCOUNTER — Other Ambulatory Visit: Payer: Self-pay | Admitting: Adult Health

## 2021-10-14 DIAGNOSIS — Z9889 Other specified postprocedural states: Secondary | ICD-10-CM

## 2021-11-07 ENCOUNTER — Ambulatory Visit
Admission: RE | Admit: 2021-11-07 | Discharge: 2021-11-07 | Disposition: A | Discharge status: Home / Self Care | Payer: Managed Care, Other (non HMO) | Source: Ambulatory Visit | Attending: Adult Health | Admitting: Adult Health

## 2021-11-07 DIAGNOSIS — Z9889 Other specified postprocedural states: Secondary | ICD-10-CM

## 2022-05-30 IMAGING — MG DIGITAL DIAGNOSTIC BILAT W/ TOMO W/ CAD
9 series · 9 of 25 positions shown · non-contrast
Comparison: Previous exam(s).

CLINICAL DATA: Right lumpectomy.  New baseline.

EXAM:
DIGITAL DIAGNOSTIC BILATERAL MAMMOGRAM WITH TOMOSYNTHESIS AND CAD;
ULTRASOUND LEFT BREAST LIMITED
TECHNIQUE: Bilateral digital diagnostic mammography and breast tomosynthesis
was performed. The images were evaluated with computer-aided
detection.; Targeted ultrasound examination of the left breast was
performed.

[R MLO]
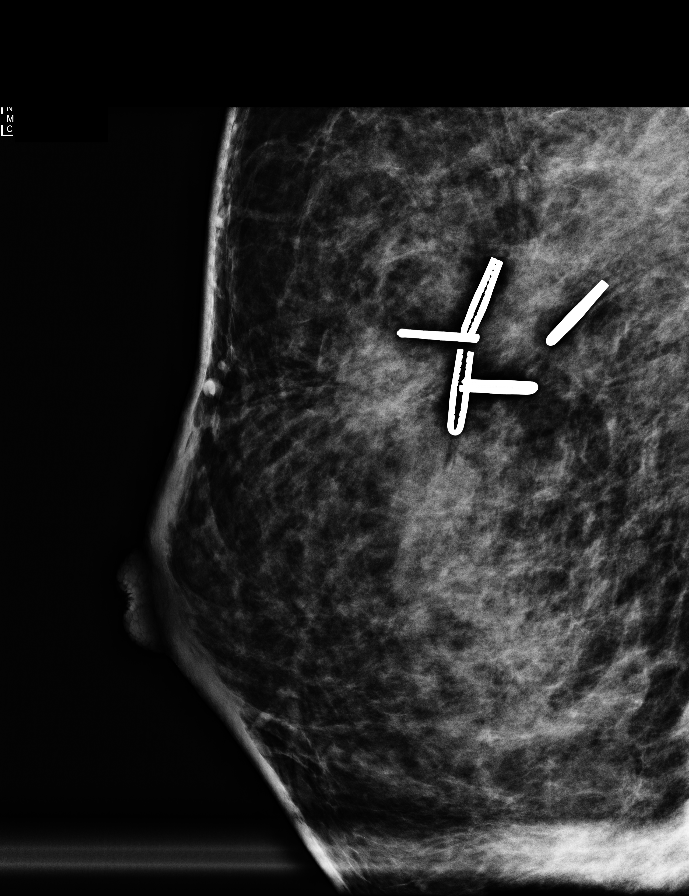

[L CC synth-2D]
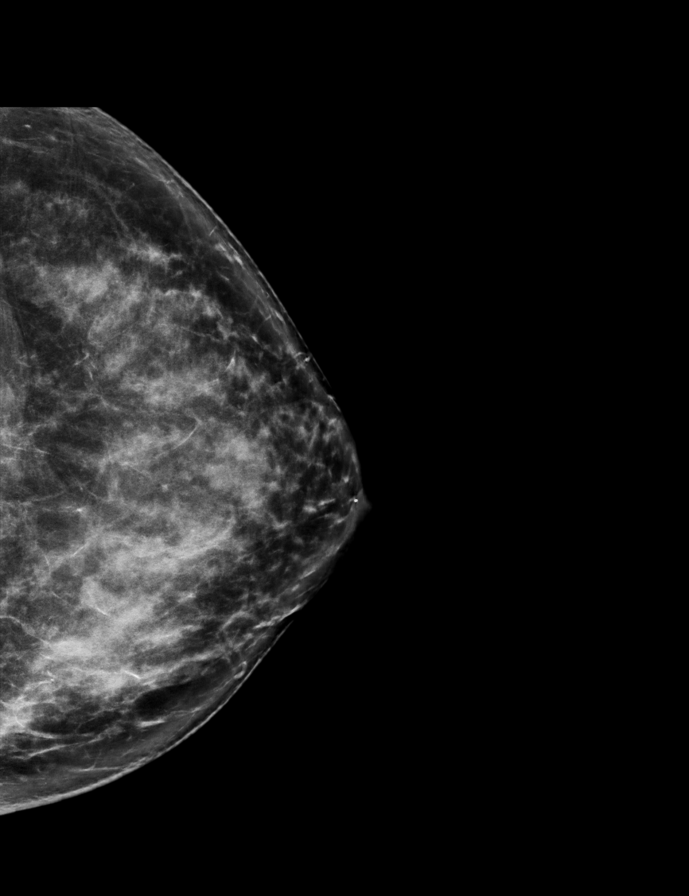

[R MLO synth-2D]
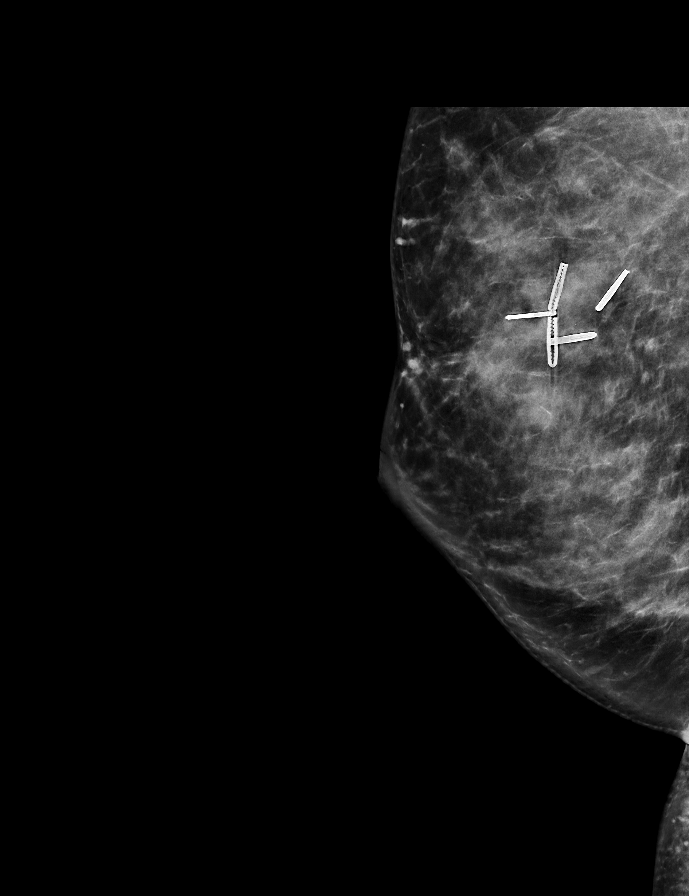

[L MLO synth-2D]
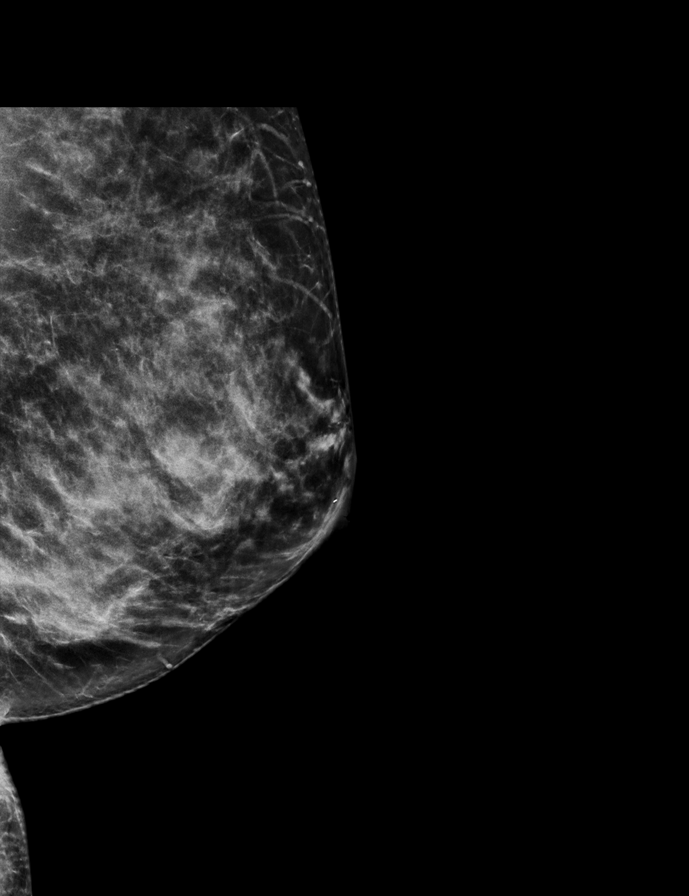

[R CC synth-2D]
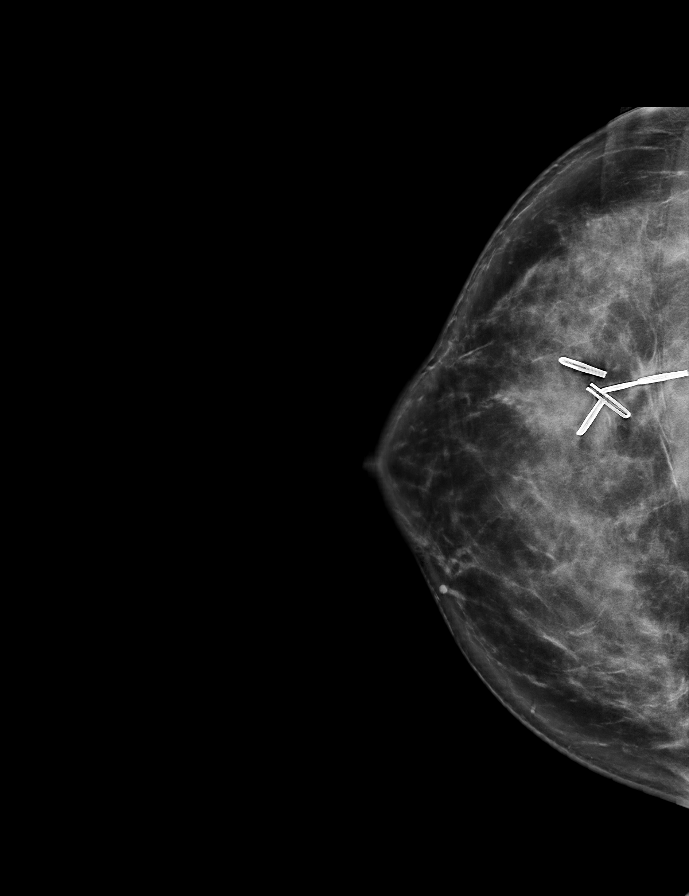

[L MLO tomo · tomo slice 34/67.0]
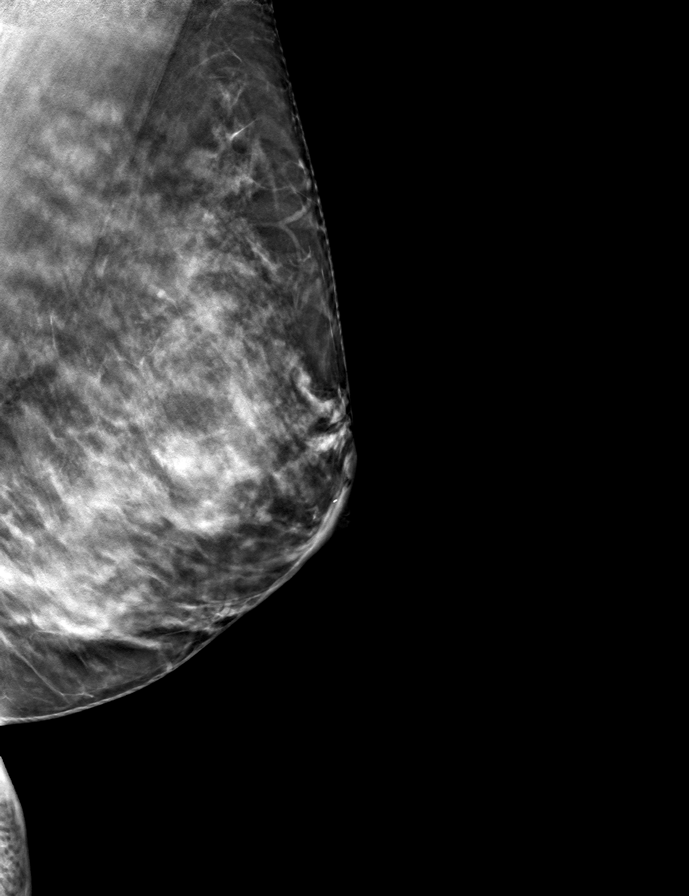

[R MLO tomo · tomo slice 38/75.0]
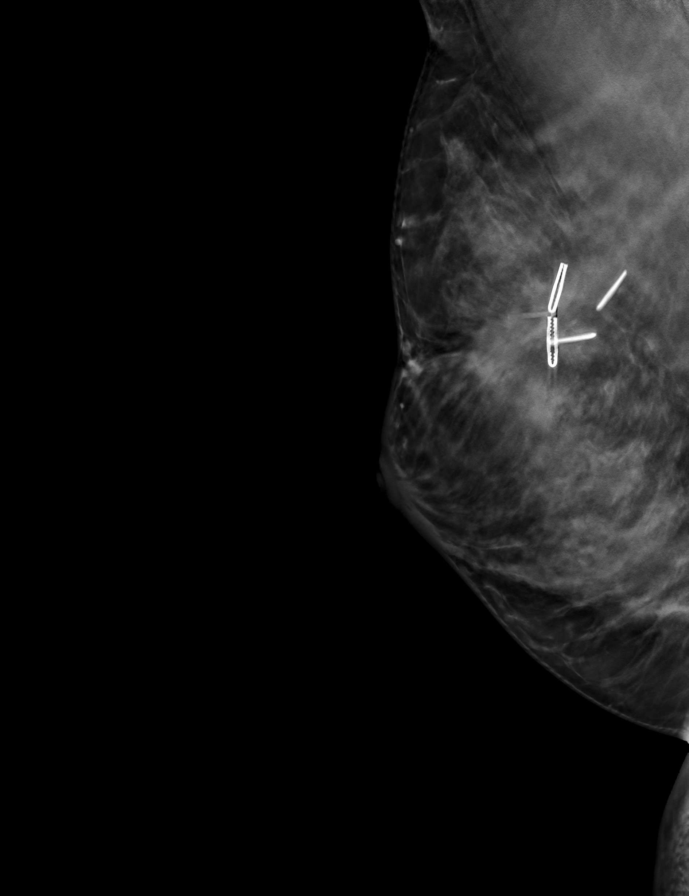

[L CC tomo · tomo slice 37/74.0]
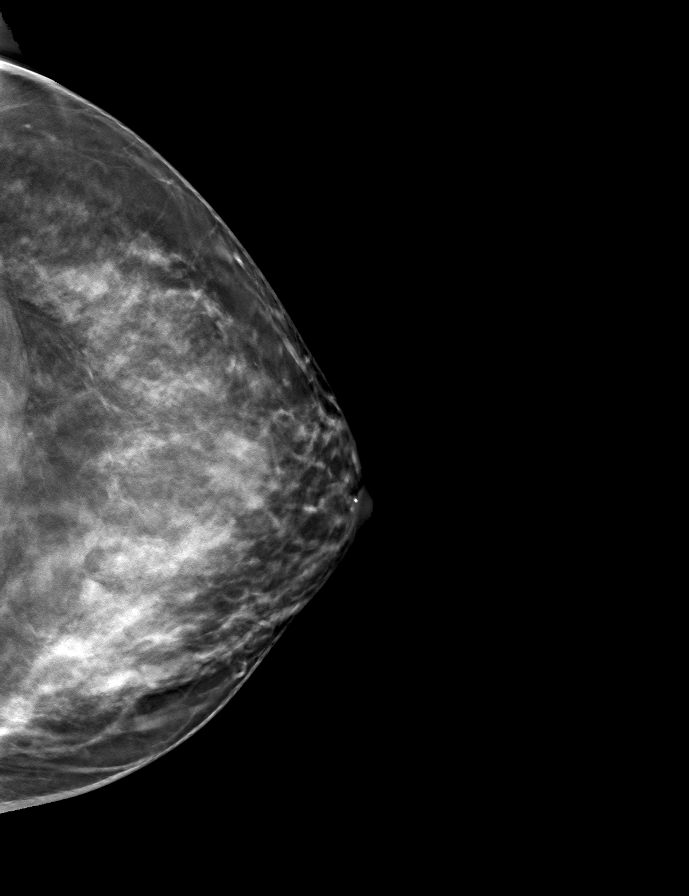

[R CC tomo · tomo slice 43/84.0]
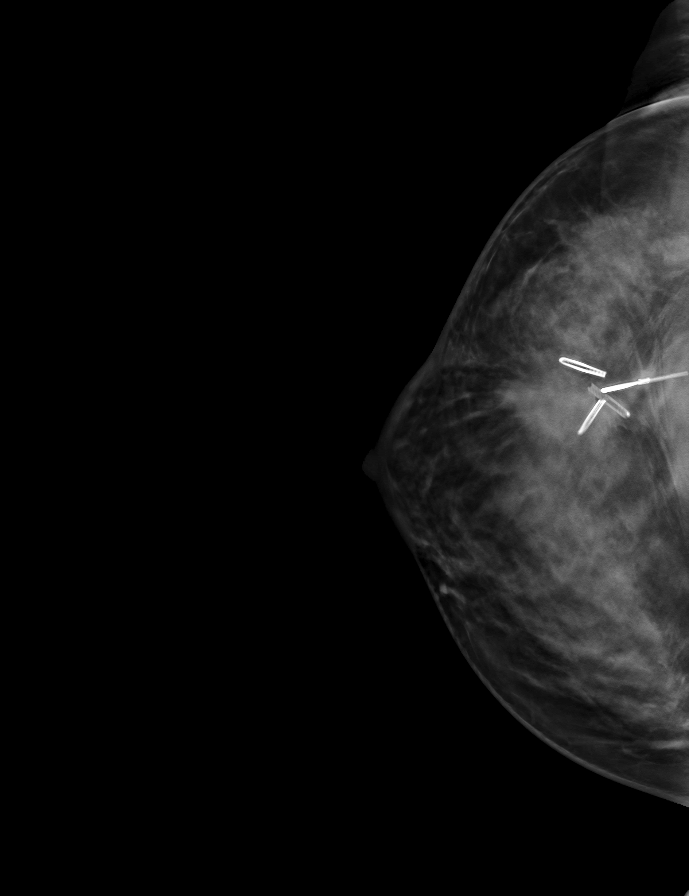

[9 of 25 positions shown; findings below may reference images not displayed]

ACR Breast Density Category c: The breast tissue is heterogeneously
dense, which may obscure small masses.
FINDINGS: There is a mass in the anterior left breast. Right lumpectomy
changes are as expected. No other suspicious findings.

On physical exam, no suspicious lumps are identified.

Targeted ultrasound is performed, showing fibrocystic changes in the
left breast accounting for the mammographically identified mass. The
mass actually represents a cluster of cysts sonographically.
IMPRESSION: Fibrocystic changes.  No evidence of malignancy.

RECOMMENDATION:
Annual diagnostic mammography.

I have discussed the findings and recommendations with the patient.
If applicable, a reminder letter will be sent to the patient
regarding the next appointment.

BI-RADS CATEGORY  2: Benign.

## 2022-06-04 ENCOUNTER — Telehealth: Payer: Self-pay | Admitting: Hematology and Oncology

## 2022-06-05 ENCOUNTER — Inpatient Hospital Stay: Payer: Managed Care, Other (non HMO) | Admitting: Hematology and Oncology

## 2022-07-04 ENCOUNTER — Telehealth: Payer: Self-pay | Admitting: Hematology and Oncology

## 2022-07-07 ENCOUNTER — Inpatient Hospital Stay: Payer: Managed Care, Other (non HMO) | Admitting: Hematology and Oncology

## 2022-07-23 NOTE — Progress Notes (Signed)
Patient Care Team: Nonnie Done., MD as PCP - General (Family Medicine) Serena Croissant, MD as Consulting Physician (Hematology and Oncology) Dorothy Puffer, MD as Consulting Physician (Radiation Oncology) Almond Lint, MD as Consulting Physician (General Surgery)  DIAGNOSIS: No diagnosis found.  SUMMARY OF ONCOLOGIC HISTORY: Oncology History  Ductal carcinoma in situ (DCIS) of right breast  04/26/2020 Initial Diagnosis   Screening mammogram showed right breast calcifications. Diagnostic mammogram of the right breast showed calcifications spanning 0.5cm. Biopsy showed low to intermediate grade DCIS, ER+ 80%, PR+ 95%.    05/11/2020 Surgery   Right lumpectomy Donell Beers): benign breast tissue with calcifications.   06/06/2020 Genetic Testing   Negative genetic testing:  No pathogenic variants detected on the [employer] CancerNext-Expanded + RNAinsight panel. Two variants of uncertain significance (VUS) were detected - one in the DICER1 gene called p.C1641W (c.4923T>G) and a second in the PALB2 gene called p.R37G (c.109C>G). The report date is 06/06/2020.  The CancerNext-Expanded + RNAinsight gene panel offered by [employer] and includes sequencing and rearrangement analysis for the following 77 genes: AIP, ALK, APC, ATM, AXIN2, BAP1, BARD1, BLM, BMPR1A, BRCA1, BRCA2, BRIP1, CDC73, CDH1, CDK4, CDKN1B, CDKN2A, CHEK2, CTNNA1, DICER1, FANCC, FH, FLCN, GALNT12, KIF1B, LZTR1, MAX, MEN1, MET, MLH1, MSH2, MSH3, MSH6, MUTYH, NBN, NF1, NF2, NTHL1, PALB2, PHOX2B, PMS2, POT1, PRKAR1A, PTCH1, PTEN, RAD51C, RAD51D, RB1, RECQL, RET, SDHA, SDHAF2, SDHB, SDHC, SDHD, SMAD4, SMARCA4, SMARCB1, SMARCE1, STK11, SUFU, TMEM127, TP53, TSC1, TSC2, VHL and XRCC2 (sequencing and deletion/duplication); EGFR, EGLN1, HOXB13, KIT, MITF, PDGFRA, POLD1 and POLE (sequencing only); EPCAM and GREM1 (deletion/duplication only). RNA data is routinely analyzed for use in variant interpretation for all genes.   07/03/2020 - 08/21/2020  Radiation Therapy   Adj XRT   09/2020 -  Anti-estrogen oral therapy   Tamoxifen daily   11/14/2020 Cancer Staging   Staging form: Breast, AJCC 8th Edition - Clinical: Stage 0 (cTis (DCIS), cN0, cM0, ER+, PR+) - Signed by Loa Socks, NP on 11/14/2020 Stage prefix: Initial diagnosis     CHIEF COMPLIANT:   INTERVAL HISTORY: Vanessa Ewing is a   ALLERGIES:  has No Known Allergies.  MEDICATIONS:  Current Outpatient Medications  Medication Sig Dispense Refill   ALPRAZolam (XANAX) 0.25 MG tablet Take 0.25 mg by mouth at bedtime as needed for anxiety.     atenolol (TENORMIN) 25 MG tablet Take 1 tablet (25 mg total) by mouth daily.     Cholecalciferol (VITAMIN D) 50 MCG (2000 UT) CAPS Take by mouth. (Patient not taking: Reported on 11/15/2020)     tamoxifen (NOLVADEX) 10 MG tablet Take 1 tablet (10 mg total) by mouth daily. 90 tablet 3   zolpidem (AMBIEN) 10 MG tablet Take 10 mg by mouth at bedtime as needed for sleep.     No current facility-administered medications for this visit.    PHYSICAL EXAMINATION: ECOG PERFORMANCE STATUS: {CHL ONC ECOG PS:[phone]}  There were no vitals filed for this visit. There were no vitals filed for this visit.  BREAST:*** No palpable masses or nodules in either right or left breasts. No palpable axillary supraclavicular or infraclavicular adenopathy no breast tenderness or nipple discharge. (exam performed in the presence of a chaperone)  LABORATORY DATA:  I have reviewed the data as listed     No data to display          No results found for: "WBC", "HGB", "HCT", "MCV", "PLT", "NEUTROABS"  ASSESSMENT & PLAN:  No problem-specific Assessment & Plan notes found for this  encounter.    No orders of the defined types were placed in this encounter.  The patient has a good understanding of the overall plan. she agrees with it. she will call with any problems that may develop before the next visit here. Total time spent: 30  mins including face to face time and time spent for planning, charting and co-ordination of care   Sherlyn Lick, CMA 07/23/22    I Janan Ridge am acting as a Neurosurgeon for [employer]  ***

## 2022-07-24 ENCOUNTER — Inpatient Hospital Stay: Payer: Managed Care, Other (non HMO) | Attending: Hematology and Oncology | Admitting: Hematology and Oncology

## 2022-07-24 VITALS — BP 135/64 | HR 85 | Temp 97.8°F | Resp 18 | Ht 63.0 in | Wt 180.5 lb

## 2022-07-24 DIAGNOSIS — Z79810 Long term (current) use of selective estrogen receptor modulators (SERMs): Secondary | ICD-10-CM | POA: Diagnosis not present

## 2022-07-24 DIAGNOSIS — D0511 Intraductal carcinoma in situ of right breast: Secondary | ICD-10-CM | POA: Diagnosis present

## 2022-07-24 DIAGNOSIS — Z79899 Other long term (current) drug therapy: Secondary | ICD-10-CM | POA: Diagnosis not present

## 2022-07-24 DIAGNOSIS — Z923 Personal history of irradiation: Secondary | ICD-10-CM | POA: Insufficient documentation

## 2022-07-24 NOTE — Assessment & Plan Note (Addendum)
04/26/2020:Screening mammogram showed right breast calcifications. Diagnostic mammogram of the right breast showed calcifications spanning 0.5cm. Biopsy showed low to intermediate grade DCIS, ER+ 80%, PR+ 95%.  05/11/2020: Right lumpectomy Dr. Donell Beers: Benign breast tissue with calcifications   Recommendation: 1. adjuvant radiation therapy 07/03/20-08/21/20 2. 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   Return to clinic in 1 year for follow-up

## 2022-07-29 ENCOUNTER — Telehealth: Payer: Self-pay | Admitting: Hematology and Oncology

## 2022-07-29 NOTE — Telephone Encounter (Signed)
Scheduled appointment per 7/11 los. Left voicemail.

## 2022-11-07 ENCOUNTER — Other Ambulatory Visit: Payer: Self-pay | Admitting: Adult Health

## 2022-11-07 DIAGNOSIS — Z9889 Other specified postprocedural states: Secondary | ICD-10-CM

## 2022-11-14 ENCOUNTER — Ambulatory Visit
Admission: RE | Admit: 2022-11-14 | Discharge: 2022-11-14 | Disposition: A | Discharge status: Home / Self Care | Payer: Managed Care, Other (non HMO) | Source: Ambulatory Visit | Attending: Adult Health | Admitting: Adult Health

## 2022-11-14 DIAGNOSIS — Z9889 Other specified postprocedural states: Secondary | ICD-10-CM

## 2023-01-28 ENCOUNTER — Other Ambulatory Visit: Payer: Self-pay | Admitting: Obstetrics and Gynecology

## 2023-02-04 ENCOUNTER — Encounter (HOSPITAL_BASED_OUTPATIENT_CLINIC_OR_DEPARTMENT_OTHER): Payer: Self-pay | Admitting: Obstetrics and Gynecology

## 2023-02-04 ENCOUNTER — Other Ambulatory Visit: Payer: Self-pay

## 2023-02-04 NOTE — Progress Notes (Signed)
Spoke w/ via phone for pre-op interview---[facility] needs dos---- cbc, istat, ekg        Lab results------none COVID test -----patient states asymptomatic no test needed Arrive at -------0645 on Friday, 02/06/23 NPO after MN NO Solid Food.  Clear liquids from MN until---0545 Med rec completed Medications to take morning of surgery -----none Diabetic medication ----- Patient instructed no nail polish to be worn day of surgery Patient instructed to bring photo id and insurance card day of surgery Patient aware to have Driver (ride ) / caregiver    for 24 hours after surgery - Driver- mother, Vanessa Ewing  /  Caregiver - daughter, Vanessa Ewing Patient Special Instructions -----none Pre-Op special Instructions -----none Patient verbalized understanding of instructions that were given at this phone interview. Patient denies chest pain, sob, fever, cough at the interview.

## 2023-02-05 NOTE — Anesthesia Preprocedure Evaluation (Addendum)
Anesthesia Evaluation  Patient identified by MRN, date of birth, ID band Patient awake    Reviewed: Allergy & Precautions, NPO status , Patient's Chart, lab work & pertinent test results  History of Anesthesia Complications Negative for: history of anesthetic complications  Airway Mallampati: I  TM Distance: >3 FB Neck ROM: Full    Dental  (+) Dental Advisory Given   Pulmonary neg pulmonary ROS   Pulmonary exam normal breath sounds clear to auscultation       Cardiovascular hypertension (atenolol), Pt. on home beta blockers (-) angina (-) Past MI, (-) Cardiac Stents and (-) CABG (-) dysrhythmias  Rhythm:Regular Rate:Normal     Neuro/Psych  Headaches, neg Seizures PSYCHIATRIC DISORDERS Anxiety        GI/Hepatic negative GI ROS, Neg liver ROS,,,  Endo/Other  negative endocrine ROS    Renal/GU negative Renal ROS     Musculoskeletal   Abdominal   Peds  Hematology negative hematology ROS (+)   Anesthesia Other Findings   Reproductive/Obstetrics H/o right breast cancer s/p lumpectomy and adjuvant radiation                              Anesthesia Physical Anesthesia Plan  ASA: 2  Anesthesia Plan: General   Post-op Pain Management: Tylenol PO (pre-op)*   Induction: Intravenous  PONV Risk Score and Plan: 3 and Ondansetron, Dexamethasone and Treatment may vary due to age or medical condition  Airway Management Planned: LMA  Additional Equipment:   Intra-op Plan:   Post-operative Plan: Extubation in OR  Informed Consent: I have reviewed the patients History and Physical, chart, labs and discussed the procedure including the risks, benefits and alternatives for the proposed anesthesia with the patient or authorized representative who has indicated his/her understanding and acceptance.     Dental advisory given  Plan Discussed with: CRNA and Anesthesiologist  Anesthesia Plan  Comments: (Risks of general anesthesia discussed including, but not limited to, sore throat, hoarse voice, chipped/damaged teeth, injury to vocal cords, nausea and vomiting, allergic reactions, lung infection, heart attack, stroke, and death. All questions answered. )       Anesthesia Quick Evaluation

## 2023-02-06 ENCOUNTER — Ambulatory Visit (HOSPITAL_BASED_OUTPATIENT_CLINIC_OR_DEPARTMENT_OTHER): Payer: Managed Care, Other (non HMO) | Admitting: Anesthesiology

## 2023-02-06 ENCOUNTER — Other Ambulatory Visit: Payer: Self-pay

## 2023-02-06 ENCOUNTER — Ambulatory Visit (HOSPITAL_BASED_OUTPATIENT_CLINIC_OR_DEPARTMENT_OTHER)
Admission: RE | Admit: 2023-02-06 | Discharge: 2023-02-06 | Disposition: A | Discharge status: Home / Self Care | Payer: Managed Care, Other (non HMO) | Attending: Obstetrics and Gynecology | Admitting: Obstetrics and Gynecology

## 2023-02-06 ENCOUNTER — Encounter (HOSPITAL_BASED_OUTPATIENT_CLINIC_OR_DEPARTMENT_OTHER)
Admission: RE | Disposition: A | Discharge status: Home / Self Care | Payer: Self-pay | Source: Home / Self Care | Attending: Obstetrics and Gynecology

## 2023-02-06 ENCOUNTER — Encounter (HOSPITAL_BASED_OUTPATIENT_CLINIC_OR_DEPARTMENT_OTHER): Payer: Self-pay | Admitting: Obstetrics and Gynecology

## 2023-02-06 DIAGNOSIS — I10 Essential (primary) hypertension: Secondary | ICD-10-CM | POA: Insufficient documentation

## 2023-02-06 DIAGNOSIS — F419 Anxiety disorder, unspecified: Secondary | ICD-10-CM | POA: Insufficient documentation

## 2023-02-06 DIAGNOSIS — N8003 Adenomyosis of the uterus: Secondary | ICD-10-CM | POA: Diagnosis not present

## 2023-02-06 DIAGNOSIS — N921 Excessive and frequent menstruation with irregular cycle: Secondary | ICD-10-CM | POA: Diagnosis not present

## 2023-02-06 DIAGNOSIS — Z79899 Other long term (current) drug therapy: Secondary | ICD-10-CM | POA: Insufficient documentation

## 2023-02-06 DIAGNOSIS — N938 Other specified abnormal uterine and vaginal bleeding: Secondary | ICD-10-CM | POA: Diagnosis present

## 2023-02-06 DIAGNOSIS — Z01818 Encounter for other preprocedural examination: Secondary | ICD-10-CM

## 2023-02-06 DIAGNOSIS — Z853 Personal history of malignant neoplasm of breast: Secondary | ICD-10-CM | POA: Diagnosis not present

## 2023-02-06 DIAGNOSIS — R9389 Abnormal findings on diagnostic imaging of other specified body structures: Secondary | ICD-10-CM | POA: Insufficient documentation

## 2023-02-06 HISTORY — DX: Presence of spectacles and contact lenses: Z97.3

## 2023-02-06 HISTORY — DX: Essential (primary) hypertension: I10

## 2023-02-06 HISTORY — DX: Headache, unspecified: R51.9

## 2023-02-06 HISTORY — PX: DILATATION & CURETTAGE/HYSTEROSCOPY WITH MYOSURE: SHX6511

## 2023-02-06 LAB — CBC
HCT: 34.1 % — ABNORMAL LOW (ref 36.0–46.0)
Hemoglobin: 10.9 g/dL — ABNORMAL LOW (ref 12.0–15.0)
MCH: 29.9 pg (ref 26.0–34.0)
MCHC: 32 g/dL (ref 30.0–36.0)
MCV: 93.7 fL (ref 80.0–100.0)
Platelets: 318 10*3/uL (ref 150–400)
RBC: 3.64 MIL/uL — ABNORMAL LOW (ref 3.87–5.11)
RDW: 12.5 % (ref 11.5–15.5)
WBC: 4 10*3/uL (ref 4.0–10.5)
nRBC: 0 % (ref 0.0–0.2)

## 2023-02-06 LAB — POCT PREGNANCY, URINE: Preg Test, Ur: NEGATIVE

## 2023-02-06 LAB — POCT I-STAT, CHEM 8
BUN: 12 mg/dL (ref 6–20)
Calcium, Ion: 1.24 mmol/L (ref 1.15–1.40)
Chloride: 106 mmol/L (ref 98–111)
Creatinine, Ser: 0.9 mg/dL (ref 0.44–1.00)
Glucose, Bld: 98 mg/dL (ref 70–99)
HCT: 34 % — ABNORMAL LOW (ref 36.0–46.0)
Hemoglobin: 11.6 g/dL — ABNORMAL LOW (ref 12.0–15.0)
Potassium: 3.8 mmol/L (ref 3.5–5.1)
Sodium: 142 mmol/L (ref 135–145)
TCO2: 23 mmol/L (ref 22–32)

## 2023-02-06 SURGERY — DILATATION & CURETTAGE/HYSTEROSCOPY WITH MYOSURE
Anesthesia: General | Site: Uterus

## 2023-02-06 MED ORDER — OXYCODONE HCL 5 MG PO TABS
5.0000 mg | ORAL_TABLET | Freq: Once | ORAL | Status: AC | PRN
  Administered 2023-02-06: 5 mg via ORAL

## 2023-02-06 MED ORDER — MIDAZOLAM HCL 2 MG/2ML IJ SOLN
INTRAMUSCULAR | Status: DC | PRN
  Administered 2023-02-06: 2 mg via INTRAVENOUS

## 2023-02-06 MED ORDER — FENTANYL CITRATE (PF) 100 MCG/2ML IJ SOLN
INTRAMUSCULAR | Status: AC
  Filled 2023-02-06: qty 2

## 2023-02-06 MED ORDER — DEXAMETHASONE SODIUM PHOSPHATE 4 MG/ML IJ SOLN
INTRAMUSCULAR | Status: DC | PRN
  Administered 2023-02-06: 8 mg via INTRAVENOUS

## 2023-02-06 MED ORDER — LACTATED RINGERS IV SOLN: INTRAVENOUS | Status: DC

## 2023-02-06 MED ORDER — STERILE WATER FOR IRRIGATION IR SOLN
Status: DC | PRN
  Administered 2023-02-06: 500 mL

## 2023-02-06 MED ORDER — PROPOFOL 10 MG/ML IV BOLUS
INTRAVENOUS | Status: DC | PRN
  Administered 2023-02-06: 200 mg via INTRAVENOUS

## 2023-02-06 MED ORDER — ACETAMINOPHEN 500 MG PO TABS
1000.0000 mg | ORAL_TABLET | Freq: Once | ORAL | Status: AC
  Administered 2023-02-06: 1000 mg via ORAL

## 2023-02-06 MED ORDER — ONDANSETRON HCL 4 MG/2ML IJ SOLN
INTRAMUSCULAR | Status: DC | PRN
  Administered 2023-02-06: 4 mg via INTRAVENOUS

## 2023-02-06 MED ORDER — SODIUM CHLORIDE 0.9 % IR SOLN
Status: DC | PRN
  Administered 2023-02-06: 3000 mL

## 2023-02-06 MED ORDER — FENTANYL CITRATE (PF) 100 MCG/2ML IJ SOLN
INTRAMUSCULAR | Status: DC | PRN
  Administered 2023-02-06: 25 ug via INTRAVENOUS
  Administered 2023-02-06: 50 ug via INTRAVENOUS
  Administered 2023-02-06: 25 ug via INTRAVENOUS

## 2023-02-06 MED ORDER — DEXMEDETOMIDINE HCL IN NACL 80 MCG/20ML IV SOLN
INTRAVENOUS | Status: DC | PRN
  Administered 2023-02-06: 4 ug via INTRAVENOUS

## 2023-02-06 MED ORDER — OXYCODONE HCL 5 MG PO TABS
ORAL_TABLET | ORAL | Status: AC
  Filled 2023-02-06: qty 1

## 2023-02-06 MED ORDER — ACETAMINOPHEN 500 MG PO TABS
ORAL_TABLET | ORAL | Status: AC
  Filled 2023-02-06: qty 2

## 2023-02-06 MED ORDER — EPHEDRINE SULFATE (PRESSORS) 50 MG/ML IJ SOLN
INTRAMUSCULAR | Status: DC | PRN
  Administered 2023-02-06: 5 mg via INTRAVENOUS

## 2023-02-06 MED ORDER — POVIDONE-IODINE 10 % EX SWAB: 2.0000 | Freq: Once | CUTANEOUS | Status: DC

## 2023-02-06 MED ORDER — AMISULPRIDE (ANTIEMETIC) 5 MG/2ML IV SOLN
10.0000 mg | Freq: Once | INTRAVENOUS | Status: DC | PRN
Start: 2023-02-06 — End: 2023-02-06

## 2023-02-06 MED ORDER — IBUPROFEN 800 MG PO TABS: 800.0000 mg | ORAL_TABLET | Freq: Three times a day (TID) | ORAL | 11 refills | Status: AC | PRN

## 2023-02-06 MED ORDER — KETOROLAC TROMETHAMINE 30 MG/ML IJ SOLN
INTRAMUSCULAR | Status: DC | PRN
  Administered 2023-02-06: 30 mg via INTRAVENOUS

## 2023-02-06 MED ORDER — LIDOCAINE HCL (CARDIAC) PF 100 MG/5ML IV SOSY
PREFILLED_SYRINGE | INTRAVENOUS | Status: DC | PRN
  Administered 2023-02-06: 50 mg via INTRAVENOUS

## 2023-02-06 MED ORDER — OXYCODONE HCL 5 MG/5ML PO SOLN: 5.0000 mg | Freq: Once | ORAL | Status: AC | PRN

## 2023-02-06 MED ORDER — FENTANYL CITRATE (PF) 100 MCG/2ML IJ SOLN
25.0000 ug | INTRAMUSCULAR | Status: DC | PRN
Start: 2023-02-06 — End: 2023-02-06

## 2023-02-06 MED ORDER — MIDAZOLAM HCL 2 MG/2ML IJ SOLN
INTRAMUSCULAR | Status: AC
Start: 2023-02-06 — End: ?
  Filled 2023-02-06: qty 2

## 2023-02-06 SURGICAL SUPPLY — 22 items
CATH ROBINSON RED A/P 16FR (CATHETERS) IMPLANT
DEVICE MYOSURE LITE (MISCELLANEOUS) IMPLANT
DEVICE MYOSURE REACH (MISCELLANEOUS) IMPLANT
DILATOR CANAL MILEX (MISCELLANEOUS) IMPLANT
DRSG TELFA 3X8 NADH STRL (GAUZE/BANDAGES/DRESSINGS) ×1 IMPLANT
GAUZE 4X4 16PLY ~~LOC~~+RFID DBL (SPONGE) ×1 IMPLANT
GLOVE BIOGEL PI IND STRL 7.0 (GLOVE) ×1 IMPLANT
GLOVE ECLIPSE 6.5 STRL STRAW (GLOVE) ×1 IMPLANT
GOWN STRL REUS W/TWL LRG LVL3 (GOWN DISPOSABLE) ×1 IMPLANT
IV NS IRRIG 3000ML ARTHROMATIC (IV SOLUTION) ×1 IMPLANT
KIT PROCEDURE FLUENT (KITS) ×1 IMPLANT
KIT TURNOVER CYSTO (KITS) ×1 IMPLANT
MYOSURE XL FIBROID (MISCELLANEOUS)
PACK VAGINAL MINOR WOMEN LF (CUSTOM PROCEDURE TRAY) ×1 IMPLANT
PAD OB MATERNITY 4.3X12.25 (PERSONAL CARE ITEMS) ×1 IMPLANT
PAD PREP 24X48 CUFFED NSTRL (MISCELLANEOUS) ×1 IMPLANT
SEAL CERVICAL OMNI LOK (ABLATOR) IMPLANT
SEAL ROD LENS SCOPE MYOSURE (ABLATOR) ×1 IMPLANT
SLEEVE SCD COMPRESS KNEE MED (STOCKING) ×1 IMPLANT
SYSTEM TISS REMOVAL MYOSURE XL (MISCELLANEOUS) IMPLANT
TOWEL OR 17X24 6PK STRL BLUE (TOWEL DISPOSABLE) ×1 IMPLANT
WATER STERILE IRR 500ML POUR (IV SOLUTION) ×1 IMPLANT

## 2023-02-06 NOTE — H&P (Signed)
Vanessa Ewing is an 48 y.o. female. G2P1011 SBF hx breast cancer presents for surgical mgmt of DUB, endometrial thickening on sonogram.  Pt has been off tamoxifen. Pt is scheduled for dx hysteroscopy, D&C, hysteroscopic resection  Pertinent Gynecological History: Menses: flow is excessive with use of 5 pads or tampons on heaviest days Bleeding: dysfunctional uterine bleeding Contraception: none DES exposure: denies Blood transfusions: none Sexually transmitted diseases: no past history Previous GYN Procedures:  c/s   Last mammogram: normal Date: 2024 Last pap: normal Date: 2024 OB History: G2, P1011   Menstrual History: Menarche age: n/a Patient's last menstrual period was 12/31/2022 (approximate).    Past Medical History:  Diagnosis Date   Anxiety    Breast cancer (HCC) 04/26/2020   right, lumpectomy and s/p adjuvant radiation 07/03/2020 to 08/21/2020   Family history of breast cancer    Family history of cervical cancer    Family history of lung cancer    Headache    takes OTC meds   Hypertension    Follows w/ PCP, Dr. Cheri Rous.   Personal history of radiation therapy    Wears glasses     Past Surgical History:  Procedure Laterality Date   BREAST LUMPECTOMY WITH RADIOACTIVE SEED LOCALIZATION Right 05/11/2020   Procedure: RIGHT BREAST LUMPECTOMY WITH RADIOACTIVE SEED LOCALIZATION;  Surgeon: Almond Lint, MD;  Location: [facility];  Service: General;  Laterality: Right;   CESAREAN SECTION  2005   EYE SURGERY     WISDOM TOOTH EXTRACTION      Family History  Problem Relation Age of Onset   Breast cancer Other        paternal great-aunt (PGM's sister)   Cervical cancer Maternal Grandmother 13   Lung cancer Paternal Grandmother        non-smoker   Breast cancer Cousin        dx 20s/30s, maternal first cousin once removed (mother's cousin)    Social History:  reports that she has never smoked. She has never used smokeless tobacco. She reports  current alcohol use. She reports that she does not use drugs.  Allergies: No Known Allergies  Medications Prior to Admission  Medication Sig Dispense Refill Last Dose/Taking   ALPRAZolam (XANAX) 0.25 MG tablet Take 0.25 mg by mouth at bedtime as needed for anxiety.   02/05/2023   atenolol (TENORMIN) 25 MG tablet Take 1 tablet (25 mg total) by mouth daily. (Patient taking differently: Take 25 mg by mouth at bedtime.)   02/05/2023 at  9:30 PM   zolpidem (AMBIEN) 10 MG tablet Take 10 mg by mouth at bedtime as needed for sleep.   02/05/2023    Review of Systems  All other systems reviewed and are negative.   Blood pressure (!) 137/59, pulse 64, temperature 97.9 F (36.6 C), temperature source Oral, resp. rate 16, height 5\' 3"  (1.6 m), weight 85.3 kg, last menstrual period 12/31/2022, SpO2 97%. Physical Exam Constitutional:      Appearance: Normal appearance.  HENT:     Head: Atraumatic.  Eyes:     Extraocular Movements: Extraocular movements intact.  Cardiovascular:     Rate and Rhythm: Regular rhythm.  Pulmonary:     Breath sounds: Normal breath sounds.  Abdominal:     Palpations: Abdomen is soft.     Comments: Pfannenstiel scar  Genitourinary:    General: Normal vulva.     Comments: Vagina no lesion  Cervix parous  Uterus  enlarged Adnexa no palp mass Musculoskeletal:  Cervical back: Neck supple.  Skin:    General: Skin is warm and dry.  Neurological:     General: No focal deficit present.     Mental Status: She is alert and oriented to person, place, and time.  Psychiatric:        Mood and Affect: Mood normal.        Behavior: Behavior normal.     Results for orders placed or performed during the hospital encounter of 02/06/23 (from the past 24 hours)  Pregnancy, urine POC     Status: None   Collection Time: 02/06/23  6:50 AM  Result Value Ref Range   Preg Test, Ur NEGATIVE NEGATIVE    No results found.  Assessment/Plan: Menometrorrhagia  Endometrial  thickening on sonogram Hx breast cancer P) dx hysteroscopy, hysteroscopic resection  , D&C. Risk of surgery includes infection, bleeding, injury to surrounding organ structures, thermal injury, fluid overload and its mgmt, uterine perforation and its risk. All ? answered  Tanaka Gillen A Azazel Franze 02/06/2023, 7:28 AM

## 2023-02-06 NOTE — Discharge Instructions (Addendum)
CALL  IF TEMP>100.4, NOTHING PER VAGINA X 2 WK, CALL IF SOAKING A MAXI  PAD EVERY HOUR OR MORE FREQUENTLY  D & C Home care Instructions:   Personal hygiene:  Used sanitary napkins for vaginal drainage not tampons. Shower or tub bathe the day after your procedure. No douching until bleeding stops. Always wipe from front to back after  Elimination.  Activity: Do not drive or operate any equipment today. The effects of the anesthesia are still present and drowsiness may result. Rest today, not necessarily flat bed rest, just take it easy. You may resume your normal activity in one to 2 days.  Sexual activity: No intercourse for one week or as indicated by your physician  Diet: Eat a light diet as desired this evening. You may resume a regular diet tomorrow.  Return to work: One to 2 days.  General Expectations of your surgery: Vaginal bleeding should be no heavier than a normal period. Spotting may continue up to 10 days. Mild cramps may continue for a couple of days. You may have a regular period in 2-6 weeks.  Unexpected observations call your doctor if these occur: persistent or heavy bleeding. Severe abdominal cramping or pain. Elevation of temperature greater than 100F.  Call for an appointment in one week.    You were given Tylenol prior to your surgery today, you may resume taking Tylenol as needed every 6 hours after 1:30pm today

## 2023-02-06 NOTE — Brief Op Note (Signed)
02/06/2023  9:42 AM  PATIENT:  Vanessa Ewing  48 y.o. female  PRE-OPERATIVE DIAGNOSIS:  dysfunctional uterine bleeding endometrial thickening on ultrasound history of breast cancer  POST-OPERATIVE DIAGNOSIS:  dysfunctional uterine bleeding endometrial thickening on ultrasound history of breast cancer  PROCEDURE:  diagnostic hysteroscopy, hysteroscopic resection of endometrial polyp and thickening using myosure, dilation and curettage  SURGEON:  Surgeons and Role:    * Maxie Better, MD - Primary  PHYSICIAN ASSISTANT:   ASSISTANTS: none   ANESTHESIA:   general  EBL:  5 ml   BLOOD ADMINISTERED:none  DRAINS: none   LOCAL MEDICATIONS USED:  NONE  SPECIMEN:  Source of Specimen:  EMC with polyp  DISPOSITION OF SPECIMEN:  PATHOLOGY  COUNTS:  YES  TOURNIQUET:  * No tourniquets in log *  DICTATION: .Other Dictation: Dictation Number [id number]  PLAN OF CARE: Discharge to home after PACU  PATIENT DISPOSITION:  PACU - hemodynamically stable.   Delay start of Pharmacological VTE agent (>24hrs) due to surgical blood loss or risk of bleeding: no

## 2023-02-06 NOTE — Anesthesia Procedure Notes (Signed)
Procedure Name: LMA Insertion Date/Time: 02/06/2023 8:51 AM  Performed by: Earmon Phoenix, CRNAPre-anesthesia Checklist: Patient identified, Emergency Drugs available, Suction available, Patient being monitored and Timeout performed Patient Re-evaluated:Patient Re-evaluated prior to induction Oxygen Delivery Method: Circle system utilized Preoxygenation: Pre-oxygenation with 100% oxygen Induction Type: IV induction Ventilation: Mask ventilation without difficulty LMA: LMA inserted LMA Size: 4.0 Number of attempts: 1 Placement Confirmation: positive ETCO2 and breath sounds checked- equal and bilateral Tube secured with: Tape Dental Injury: Teeth and Oropharynx as per pre-operative assessment

## 2023-02-06 NOTE — Transfer of Care (Signed)
Immediate Anesthesia Transfer of Care Note  Patient: Vanessa Ewing  Procedure(s) Performed: DILATATION & CURETTAGE/HYSTEROSCOPY WITH MYOSURE (Uterus)  Patient Location: PACU  Anesthesia Type:General  Level of Consciousness: awake and patient cooperative  Airway & Oxygen Therapy: Patient Spontanous Breathing and Patient connected to nasal cannula oxygen  Post-op Assessment: Report given to RN and Post -op Vital signs reviewed and stable  Post vital signs: Reviewed and stable  Last Vitals:  Vitals Value Taken Time  BP    Temp    Pulse 65 02/06/23 0926  Resp 15 02/06/23 0926  SpO2 100 % 02/06/23 0926  Vitals shown include unfiled device data.  Last Pain:  Vitals:   02/06/23 0707  TempSrc: Oral  PainSc: 0-No pain      Patients Stated Pain Goal: 3 (02/06/23 0707)  Complications: No notable events documented.

## 2023-02-06 NOTE — Anesthesia Postprocedure Evaluation (Signed)
Anesthesia Post Note  Patient: Vanessa Ewing  Procedure(s) Performed: DILATATION & CURETTAGE/HYSTEROSCOPY WITH MYOSURE (Uterus)     Patient location during evaluation: PACU Anesthesia Type: General Level of consciousness: awake Pain management: pain level controlled Vital Signs Assessment: post-procedure vital signs reviewed and stable Respiratory status: spontaneous breathing, nonlabored ventilation and respiratory function stable Cardiovascular status: blood pressure returned to baseline and stable Postop Assessment: no apparent nausea or vomiting Anesthetic complications: no   No notable events documented.  Last Vitals:  Vitals:   02/06/23 0707 02/06/23 0926  BP: (!) 137/59 118/82  Pulse: 64   Resp: 16   Temp: 36.6 C 36.5 C  SpO2: 97%     Last Pain:  Vitals:   02/06/23 0930  TempSrc:   PainSc: 3                  Linton Rump

## 2023-02-06 NOTE — Op Note (Signed)
NAME: Vanessa Ewing, Vanessa Ewing MEDICAL RECORD NO: [id number] ACCOUNT NO: [id number] DATE OF BIRTH: December 06, 1975 FACILITY: [facility] LOCATION: [facility] PHYSICIAN: Asiah Browder A. Cherly Hensen, MD  Operative Report   DATE OF PROCEDURE: 02/06/2023  PREOPERATIVE DIAGNOSES: 1.  Dysfunctional uterine bleeding. 2.  Endometrial thickening on ultrasound. 3.  History of breast cancer.  PROCEDURES PERFORMED: Diagnostic hysteroscopy, hysteroscopic resection of endometrial thickening polyps and endometrial thickening using MyoSure, dilation and curettage.  POSTOPERATIVE DIAGNOSES: 1.  Dysfunctional uterine bleeding. 2.  Endometrial thickening on ultrasound. 3.  History of breast cancer.  ANESTHESIA:  General.  SURGEON: Maxie Better, MD  ASSISTANT:  None.  DESCRIPTION OF PROCEDURE:  Under adequate general anesthesia, the patient was placed in the dorsal lithotomy position.  She was sterilely prepped and draped in the usual fashion.  The patient was not catheterized as she had voided prior to entering the  room.  A bivalve speculum was placed in the vagina.  A single-tooth tenaculum was placed on the anterior lip of the cervix.  The cervix was dilated up to #19 [facility] dilator.  A diagnostic hysteroscope was introduced into the uterine cavity.  Endometrial  thickening and polypoid lesions throughout was noted. The Reach resectoscope was introduced into the uterine cavity and the endometrium with polypoid lesions  was resected entirely and the endocervical canal was inspected.  No lesions were noted in the canal.  When all tissue was felt to have been removed, all instruments were then removed from the vagina.  SPECIMEN: Labelled endometrial curetting with polyps were sent to pathology.  ESTIMATED BLOOD LOSS: 5 mL.  FLUID DEFICIT: 200 mL.  INTRAOPERATIVE FLUIDS: 500 mL.  COUNTS: Sponge and instrument counts x 2 were correct.  COMPLICATIONS: None.  DISPOSITION: The patient tolerated the  procedure well and was transferred to the recovery room in stable condition.    MUK D: 02/06/2023 9:47:01 am T: 02/06/2023 9:53:00 am  JOB: [id number]/ [id number]

## 2023-02-07 ENCOUNTER — Encounter (HOSPITAL_BASED_OUTPATIENT_CLINIC_OR_DEPARTMENT_OTHER): Payer: Self-pay | Admitting: Obstetrics and Gynecology

## 2023-02-09 LAB — SURGICAL PATHOLOGY

## 2023-07-29 ENCOUNTER — Ambulatory Visit: Payer: Managed Care, Other (non HMO) | Admitting: Hematology and Oncology

## 2023-08-31 ENCOUNTER — Ambulatory Visit: Admitting: Hematology and Oncology

## 2023-09-01 ENCOUNTER — Inpatient Hospital Stay: Attending: Hematology and Oncology | Admitting: Hematology and Oncology

## 2023-09-01 VITALS — BP 138/80 | HR 80 | Temp 98.0°F | Resp 18 | Wt 198.8 lb

## 2023-09-01 DIAGNOSIS — D0511 Intraductal carcinoma in situ of right breast: Secondary | ICD-10-CM

## 2023-09-01 DIAGNOSIS — Z923 Personal history of irradiation: Secondary | ICD-10-CM | POA: Insufficient documentation

## 2023-09-01 DIAGNOSIS — Z86000 Personal history of in-situ neoplasm of breast: Secondary | ICD-10-CM | POA: Insufficient documentation

## 2023-09-01 NOTE — Progress Notes (Signed)
 Patient Care Team: Sabas Norleen PARAS., MD as PCP - General (Family Medicine) Odean Potts, MD as Consulting Physician (Hematology and Oncology) Dewey Norleen, MD as Consulting Physician (Radiation Oncology) Aron Shoulders, MD as Consulting Physician (General Surgery)  DIAGNOSIS:  Encounter Diagnosis  Name Primary?   Ductal carcinoma in situ (DCIS) of right breast Yes    SUMMARY OF ONCOLOGIC HISTORY: Oncology History  Ductal carcinoma in situ (DCIS) of right breast  04/26/2020 Initial Diagnosis   Screening mammogram showed right breast calcifications. Diagnostic mammogram of the right breast showed calcifications spanning 0.5cm. Biopsy showed low to intermediate grade DCIS, ER+ 80%, PR+ 95%.    05/11/2020 Surgery   Right lumpectomy Azucena): benign breast tissue with calcifications.   06/06/2020 Genetic Testing   Negative genetic testing:  No pathogenic variants detected on the [employer] CancerNext-Expanded + RNAinsight panel. Two variants of uncertain significance (VUS) were detected - one in the DICER1 gene called p.C1641W (c.4923T>G) and a second in the PALB2 gene called p.R37G (c.109C>G). The report date is 06/06/2020.  The CancerNext-Expanded + RNAinsight gene panel offered by [employer] and includes sequencing and rearrangement analysis for the following 77 genes: AIP, ALK, APC, ATM, AXIN2, BAP1, BARD1, BLM, BMPR1A, BRCA1, BRCA2, BRIP1, CDC73, CDH1, CDK4, CDKN1B, CDKN2A, CHEK2, CTNNA1, DICER1, FANCC, FH, FLCN, GALNT12, KIF1B, LZTR1, MAX, MEN1, MET, MLH1, MSH2, MSH3, MSH6, MUTYH, NBN, NF1, NF2, NTHL1, PALB2, PHOX2B, PMS2, POT1, PRKAR1A, PTCH1, PTEN, RAD51C, RAD51D, RB1, RECQL, RET, SDHA, SDHAF2, SDHB, SDHC, SDHD, SMAD4, SMARCA4, SMARCB1, SMARCE1, STK11, SUFU, TMEM127, TP53, TSC1, TSC2, VHL and XRCC2 (sequencing and deletion/duplication); EGFR, EGLN1, HOXB13, KIT, MITF, PDGFRA, POLD1 and POLE (sequencing only); EPCAM and GREM1 (deletion/duplication only). RNA data is routinely analyzed for  use in variant interpretation for all genes.   07/03/2020 - 08/21/2020 Radiation Therapy   Adj XRT   09/2020 -  Anti-estrogen oral therapy   Tamoxifen  daily   11/14/2020 Cancer Staging   Staging form: Breast, AJCC 8th Edition - Clinical: Stage 0 (cTis (DCIS), cN0, cM0, ER+, PR+) - Signed by Crawford Morna Pickle, NP on 11/14/2020 Stage prefix: Initial diagnosis     CHIEF COMPLIANT:   HISTORY OF PRESENT ILLNESS:  History of Present Illness Vanessa Ewing is a 48 year old female with history of DCIS who could not tolerate tamoxifen  and is here for annual follow-up.  She presents with prolonged uterine bleeding.  She has experienced prolonged uterine bleeding for the past month, similar to an episode in December that lasted for a month and was resolved with a dilation and curettage (D&C) in January. The bleeding has recurred, and she is currently taking a medication prescribed by her gynecologist to manage it. She discontinued hormone therapy over a year ago and is currently taking progesterone to manage her symptoms. She attributes her symptoms to perimenopause, noting that her menstrual cycles were normal until December when they became irregular. She denies breast pain or discomfort.     ALLERGIES:  has no known allergies.  MEDICATIONS:  Current Outpatient Medications  Medication Sig Dispense Refill   ALPRAZolam  (XANAX ) 0.25 MG tablet Take 0.25 mg by mouth at bedtime as needed for anxiety.     atenolol  (TENORMIN ) 25 MG tablet Take 1 tablet (25 mg total) by mouth daily.     ibuprofen  (ADVIL ) 800 MG tablet Take 1 tablet (800 mg total) by mouth every 8 (eight) hours as needed. 30 tablet 11   zolpidem  (AMBIEN ) 10 MG tablet Take 10 mg by mouth at bedtime as needed  for sleep.     No current facility-administered medications for this visit.    PHYSICAL EXAMINATION: ECOG PERFORMANCE STATUS: 1 - Symptomatic but completely ambulatory  Vitals:   09/01/23 1157  BP: 138/80  Pulse: 80   Resp: 18  Temp: 98 F (36.7 C)  SpO2: 100%   Filed Weights   09/01/23 1157  Weight: 198 lb 12.8 oz (90.2 kg)      LABORATORY DATA:  I have reviewed the data as listed    Latest Ref Rng & Units 02/06/2023    7:30 AM  CMP  Glucose 70 - 99 mg/dL 98   BUN 6 - 20 mg/dL 12   Creatinine 9.55 - 1.00 mg/dL 9.09   Sodium 864 - 854 mmol/L 142   Potassium 3.5 - 5.1 mmol/L 3.8   Chloride 98 - 111 mmol/L 106     Lab Results  Component Value Date   WBC 4.0 02/06/2023   HGB 11.6 (L) 02/06/2023   HCT 34.0 (L) 02/06/2023   MCV 93.7 02/06/2023   PLT 318 02/06/2023    ASSESSMENT & PLAN:  Ductal carcinoma in situ (DCIS) of right breast 04/26/2020:Screening mammogram showed right breast calcifications. Diagnostic mammogram of the right breast showed calcifications spanning 0.5cm. Biopsy showed low to intermediate grade DCIS, ER+ 80%, PR+ 95%.  05/11/2020: Right lumpectomy Dr. Aron: Benign breast tissue with calcifications   Recommendation: 1. adjuvant radiation therapy 07/03/20-08/21/20 2. 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   Return to clinic in 1 year for follow-up     No orders of the defined types were placed in this encounter.  The patient has a good understanding of the overall plan. she agrees with it. she will call with any problems that may develop before the next visit here. Total time spent: 30 mins including face to face time and time spent for planning, charting and co-ordination of care   Naomi MARLA Chad, MD 09/01/23

## 2023-09-01 NOTE — Assessment & Plan Note (Signed)
 04/26/2020:Screening mammogram showed right breast calcifications. Diagnostic mammogram of the right breast showed calcifications spanning 0.5cm. Biopsy showed low to intermediate grade DCIS, ER+ 80%, PR+ 95%.  05/11/2020: Right lumpectomy Dr. Aron: Benign breast tissue with calcifications   Recommendation: 1. adjuvant radiation therapy 07/03/20-08/21/20 2. 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   Return to clinic in 1 year for follow-up

## 2023-09-09 ENCOUNTER — Telehealth: Payer: Self-pay

## 2023-09-09 NOTE — Telephone Encounter (Signed)
 Per md orders entered for [facility] and all supported documents faxed to [phone]. Faxed confirmation was received.

## 2023-09-23 ENCOUNTER — Encounter: Payer: Self-pay | Admitting: *Deleted

## 2023-09-23 NOTE — Progress Notes (Signed)
 Received message from [facility] team stating patient was non responsive to mobile phlebotomy team.  Testing will be canceled at this time.  If pt wishes to proceed in the future, orders will be placed.

## 2023-10-15 ENCOUNTER — Other Ambulatory Visit: Payer: Self-pay | Admitting: Obstetrics and Gynecology

## 2023-10-15 DIAGNOSIS — Z1231 Encounter for screening mammogram for malignant neoplasm of breast: Secondary | ICD-10-CM

## 2023-11-30 ENCOUNTER — Ambulatory Visit

## 2023-12-01 ENCOUNTER — Ambulatory Visit
Admission: RE | Admit: 2023-12-01 | Discharge: 2023-12-01 | Disposition: A | Source: Ambulatory Visit | Attending: Obstetrics and Gynecology | Admitting: Obstetrics and Gynecology

## 2023-12-01 DIAGNOSIS — Z1231 Encounter for screening mammogram for malignant neoplasm of breast: Secondary | ICD-10-CM

## 2023-12-04 ENCOUNTER — Other Ambulatory Visit: Payer: Self-pay | Admitting: Obstetrics and Gynecology

## 2023-12-04 DIAGNOSIS — R928 Other abnormal and inconclusive findings on diagnostic imaging of breast: Secondary | ICD-10-CM

## 2023-12-25 ENCOUNTER — Ambulatory Visit

## 2023-12-25 ENCOUNTER — Ambulatory Visit
Admission: RE | Admit: 2023-12-25 | Discharge: 2023-12-25 | Disposition: A | Source: Ambulatory Visit | Attending: Obstetrics and Gynecology | Admitting: Obstetrics and Gynecology

## 2023-12-25 DIAGNOSIS — R928 Other abnormal and inconclusive findings on diagnostic imaging of breast: Secondary | ICD-10-CM

## 2024-08-31 ENCOUNTER — Ambulatory Visit: Admitting: Hematology and Oncology
